# Patient Record
Sex: Female | Born: 1955 | Race: White | Hispanic: No | Marital: Married | Smoking: Never smoker
Health system: Southern US, Community
[De-identification: ages and names within clinical notes are randomized; demographics above are authoritative.]

## PROBLEM LIST (undated history)

## (undated) DIAGNOSIS — D649 Anemia, unspecified: Secondary | ICD-10-CM

## (undated) DIAGNOSIS — K219 Gastro-esophageal reflux disease without esophagitis: Secondary | ICD-10-CM

## (undated) DIAGNOSIS — M4326 Fusion of spine, lumbar region: Secondary | ICD-10-CM

## (undated) DIAGNOSIS — E7800 Pure hypercholesterolemia, unspecified: Secondary | ICD-10-CM

## (undated) DIAGNOSIS — M961 Postlaminectomy syndrome, not elsewhere classified: Secondary | ICD-10-CM

## (undated) DIAGNOSIS — R51 Headache: Secondary | ICD-10-CM

## (undated) DIAGNOSIS — I10 Essential (primary) hypertension: Secondary | ICD-10-CM

## (undated) DIAGNOSIS — F32A Depression, unspecified: Secondary | ICD-10-CM

## (undated) DIAGNOSIS — F419 Anxiety disorder, unspecified: Secondary | ICD-10-CM

## (undated) DIAGNOSIS — M7918 Myalgia, other site: Secondary | ICD-10-CM

## (undated) DIAGNOSIS — M5106 Intervertebral disc disorders with myelopathy, lumbar region: Secondary | ICD-10-CM

## (undated) DIAGNOSIS — M4712 Other spondylosis with myelopathy, cervical region: Secondary | ICD-10-CM

## (undated) DIAGNOSIS — I959 Hypotension, unspecified: Secondary | ICD-10-CM

## (undated) DIAGNOSIS — M5440 Lumbago with sciatica, unspecified side: Secondary | ICD-10-CM

## (undated) DIAGNOSIS — F329 Major depressive disorder, single episode, unspecified: Secondary | ICD-10-CM

## (undated) DIAGNOSIS — Q762 Congenital spondylolisthesis: Secondary | ICD-10-CM

## (undated) HISTORY — DX: Essential (primary) hypertension: I10

## (undated) HISTORY — DX: Depression, unspecified: F32.A

## (undated) HISTORY — DX: Fusion of spine, lumbar region: M43.26

## (undated) HISTORY — DX: Gastro-esophageal reflux disease without esophagitis: K21.9

## (undated) HISTORY — PX: OTHER SURGICAL HISTORY: SHX169

## (undated) HISTORY — DX: Anxiety disorder, unspecified: F41.9

## (undated) HISTORY — PX: SPINE SURGERY: SHX786

## (undated) HISTORY — DX: Major depressive disorder, single episode, unspecified: F32.9

## (undated) HISTORY — DX: Postlaminectomy syndrome, not elsewhere classified: M96.1

## (undated) HISTORY — DX: Congenital spondylolisthesis: Q76.2

## (undated) HISTORY — DX: Myalgia, other site: M79.18

## (undated) HISTORY — DX: Pure hypercholesterolemia, unspecified: E78.00

## (undated) HISTORY — DX: Lumbago with sciatica, unspecified side: M54.40

## (undated) HISTORY — DX: Headache: R51

## (undated) HISTORY — DX: Intervertebral disc disorders with myelopathy, lumbar region: M51.06

## (undated) HISTORY — DX: Hypotension, unspecified: I95.9

## (undated) HISTORY — DX: Other spondylosis with myelopathy, cervical region: M47.12

## (undated) HISTORY — DX: Anemia, unspecified: D64.9

---

## 2009-01-30 ENCOUNTER — Encounter: Admission: RE | Admit: 2009-01-30 | Discharge: 2009-01-30 | Payer: Self-pay | Admitting: Unknown Physician Specialty

## 2009-01-30 IMAGING — CR DG KNEE COMPLETE 4+V*R*
4 series · 4 of 4 positions shown · non-contrast
Comparison: None

CLINICAL DATA: Right knee pain/injury [DATE].

RIGHT KNEE - COMPLETE 4+ VIEW

[view not recorded (1 of 4)]
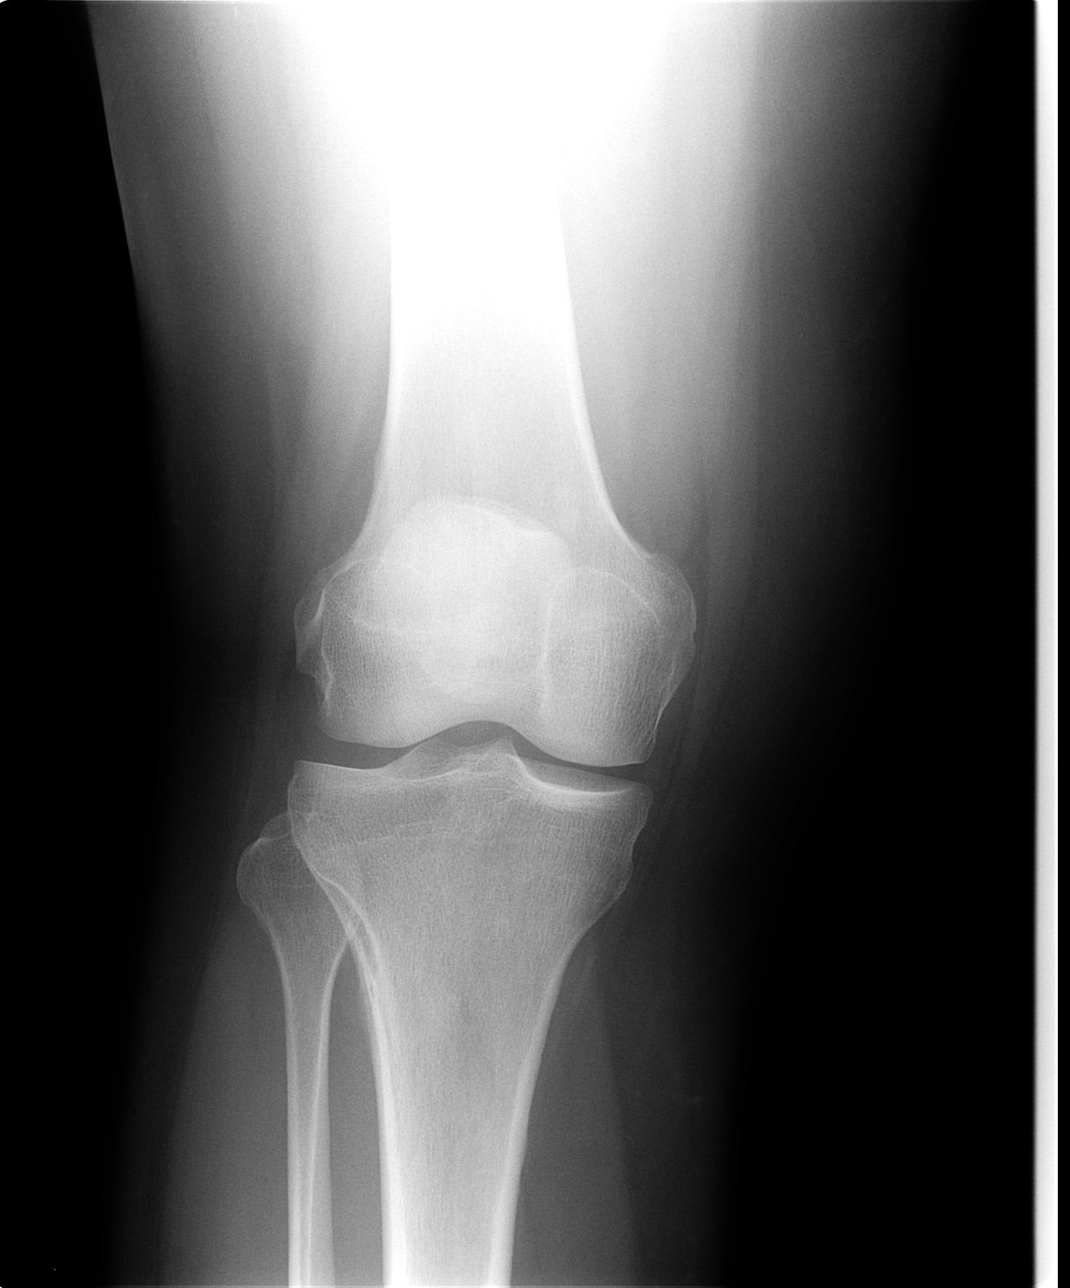

[view not recorded (2 of 4)]
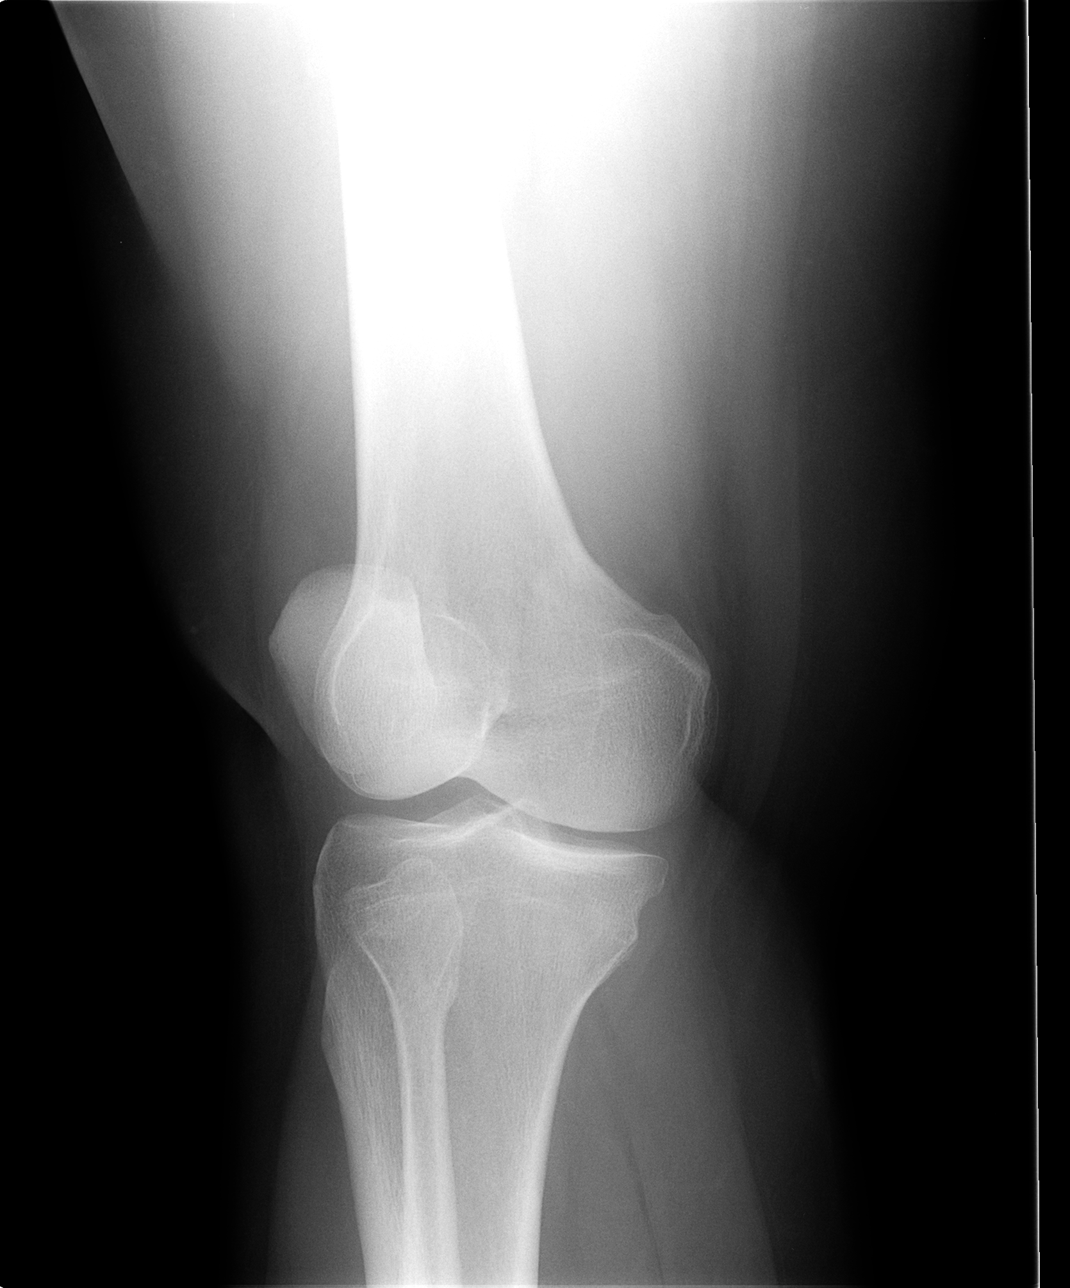

[view not recorded (3 of 4)]
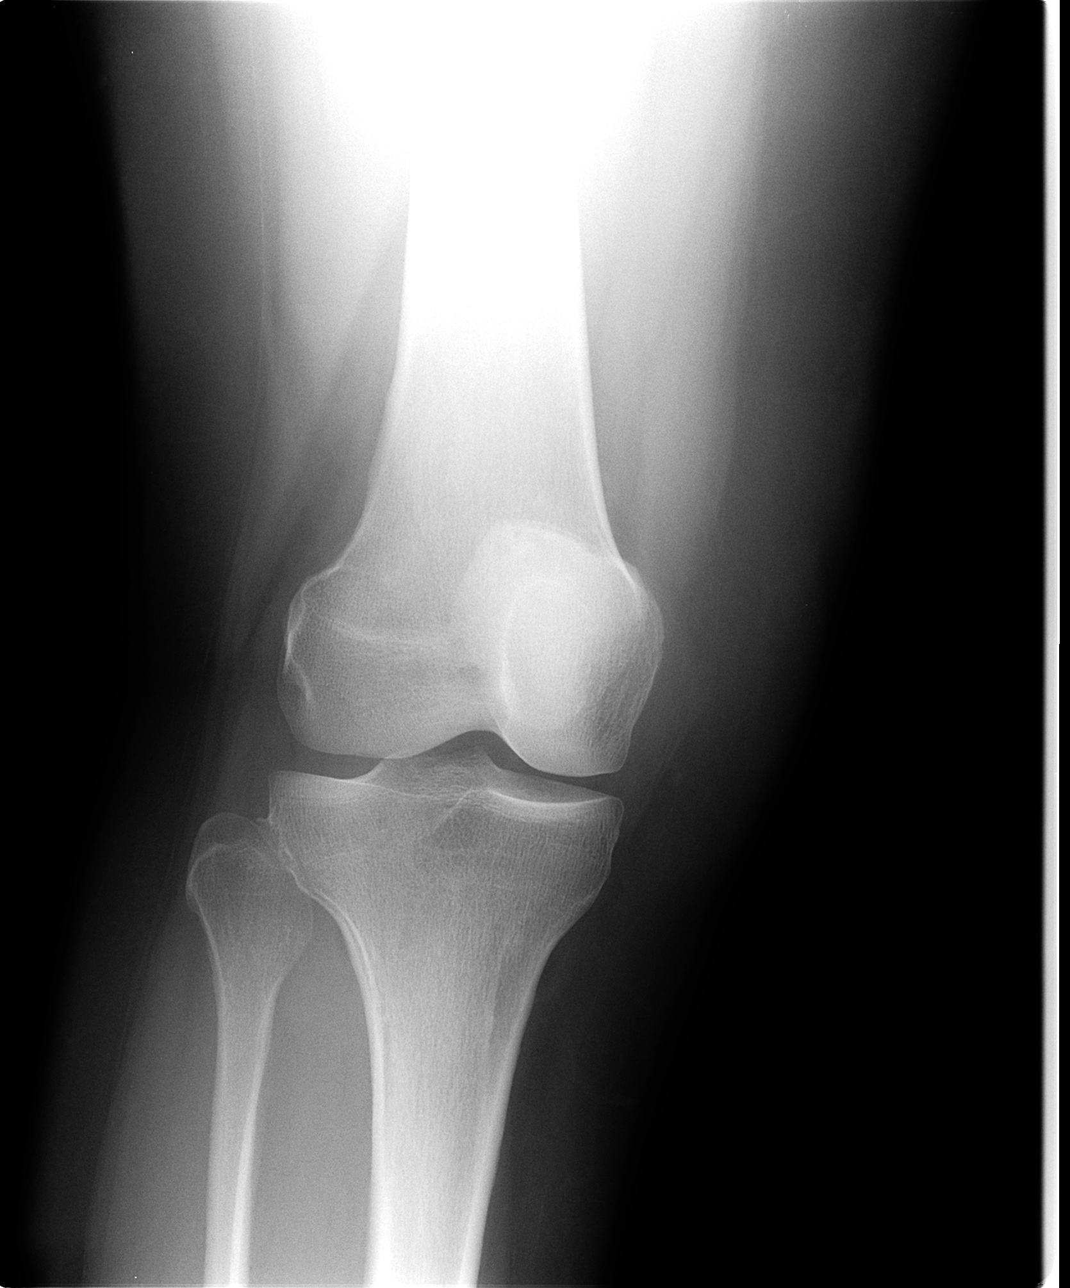

[view not recorded (4 of 4)]
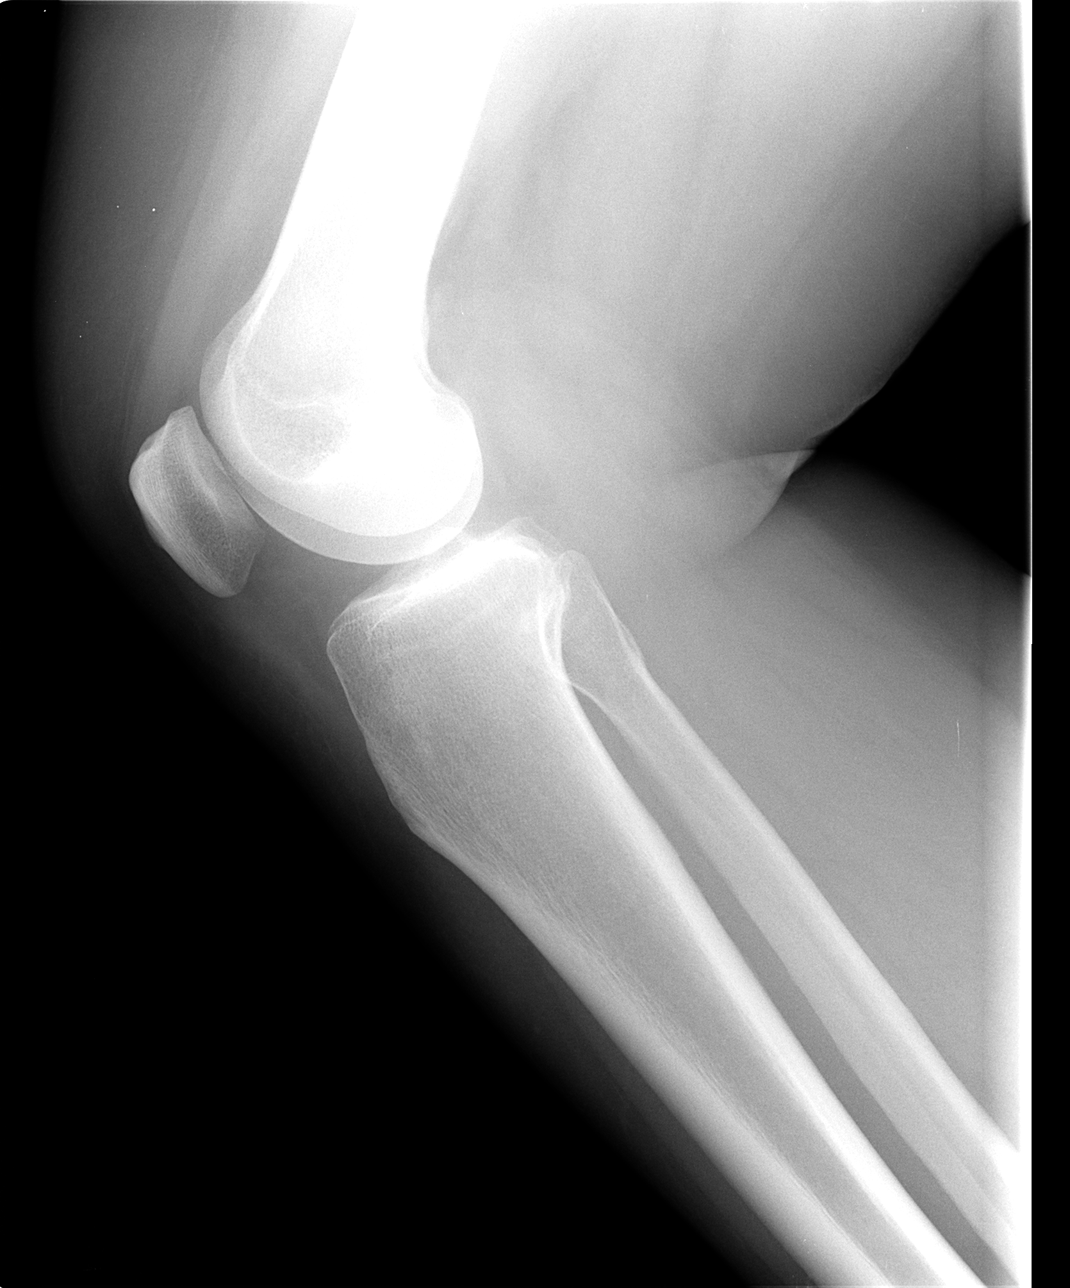

[4 of 4 positions shown; findings below may reference images not displayed]

FINDINGS: No acute fracture, subluxation, radiopaque foreign body,
or joint effusion is seen.  No other significant osseous, articular
abnormality visualized.
IMPRESSION: Negative.

## 2010-01-04 ENCOUNTER — Ambulatory Visit (HOSPITAL_COMMUNITY): Payer: Self-pay | Admitting: Psychiatry

## 2010-02-11 ENCOUNTER — Ambulatory Visit (HOSPITAL_COMMUNITY): Payer: Self-pay | Admitting: Psychiatry

## 2010-03-11 ENCOUNTER — Ambulatory Visit (HOSPITAL_COMMUNITY): Payer: Self-pay | Admitting: Licensed Clinical Social Worker

## 2010-03-20 ENCOUNTER — Ambulatory Visit (HOSPITAL_COMMUNITY): Payer: Self-pay | Admitting: Licensed Clinical Social Worker

## 2010-03-25 ENCOUNTER — Ambulatory Visit (HOSPITAL_COMMUNITY): Payer: Self-pay | Admitting: Psychiatry

## 2010-03-27 ENCOUNTER — Ambulatory Visit (HOSPITAL_COMMUNITY): Payer: Self-pay | Admitting: Licensed Clinical Social Worker

## 2010-04-10 ENCOUNTER — Ambulatory Visit (HOSPITAL_COMMUNITY): Payer: Self-pay | Admitting: Licensed Clinical Social Worker

## 2010-05-15 ENCOUNTER — Ambulatory Visit (HOSPITAL_COMMUNITY): Payer: Self-pay | Admitting: Psychiatry

## 2010-05-16 ENCOUNTER — Ambulatory Visit (HOSPITAL_COMMUNITY): Payer: Self-pay | Admitting: Licensed Clinical Social Worker

## 2010-05-31 IMAGING — CR DG CHEST 2V
2 series · 2 of 2 positions shown · non-contrast
Comparison: None

CLINICAL DATA: Preoperative evaluation.  Herniated disc.  No
current complaints.  Nonsmoker.  Hypertension treated medically

CHEST - 2 VIEW

[view not recorded (1 of 2)]
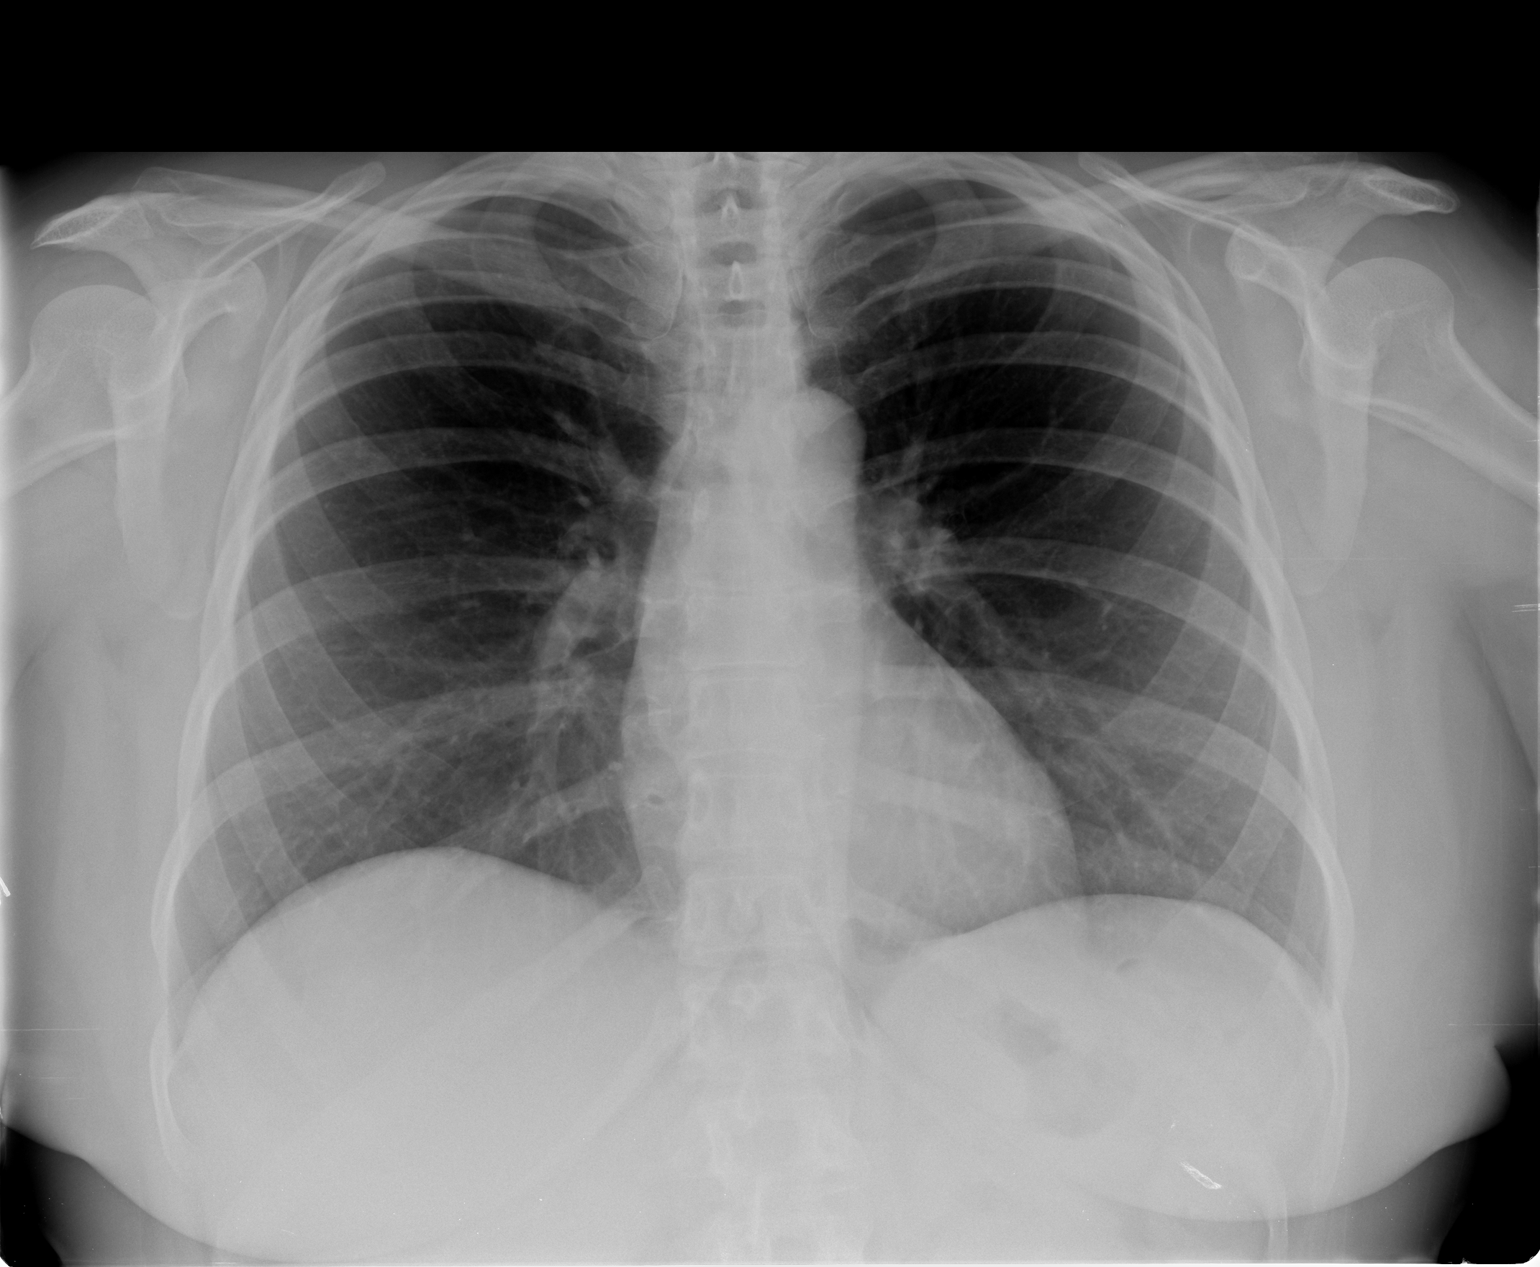

[view not recorded (2 of 2)]
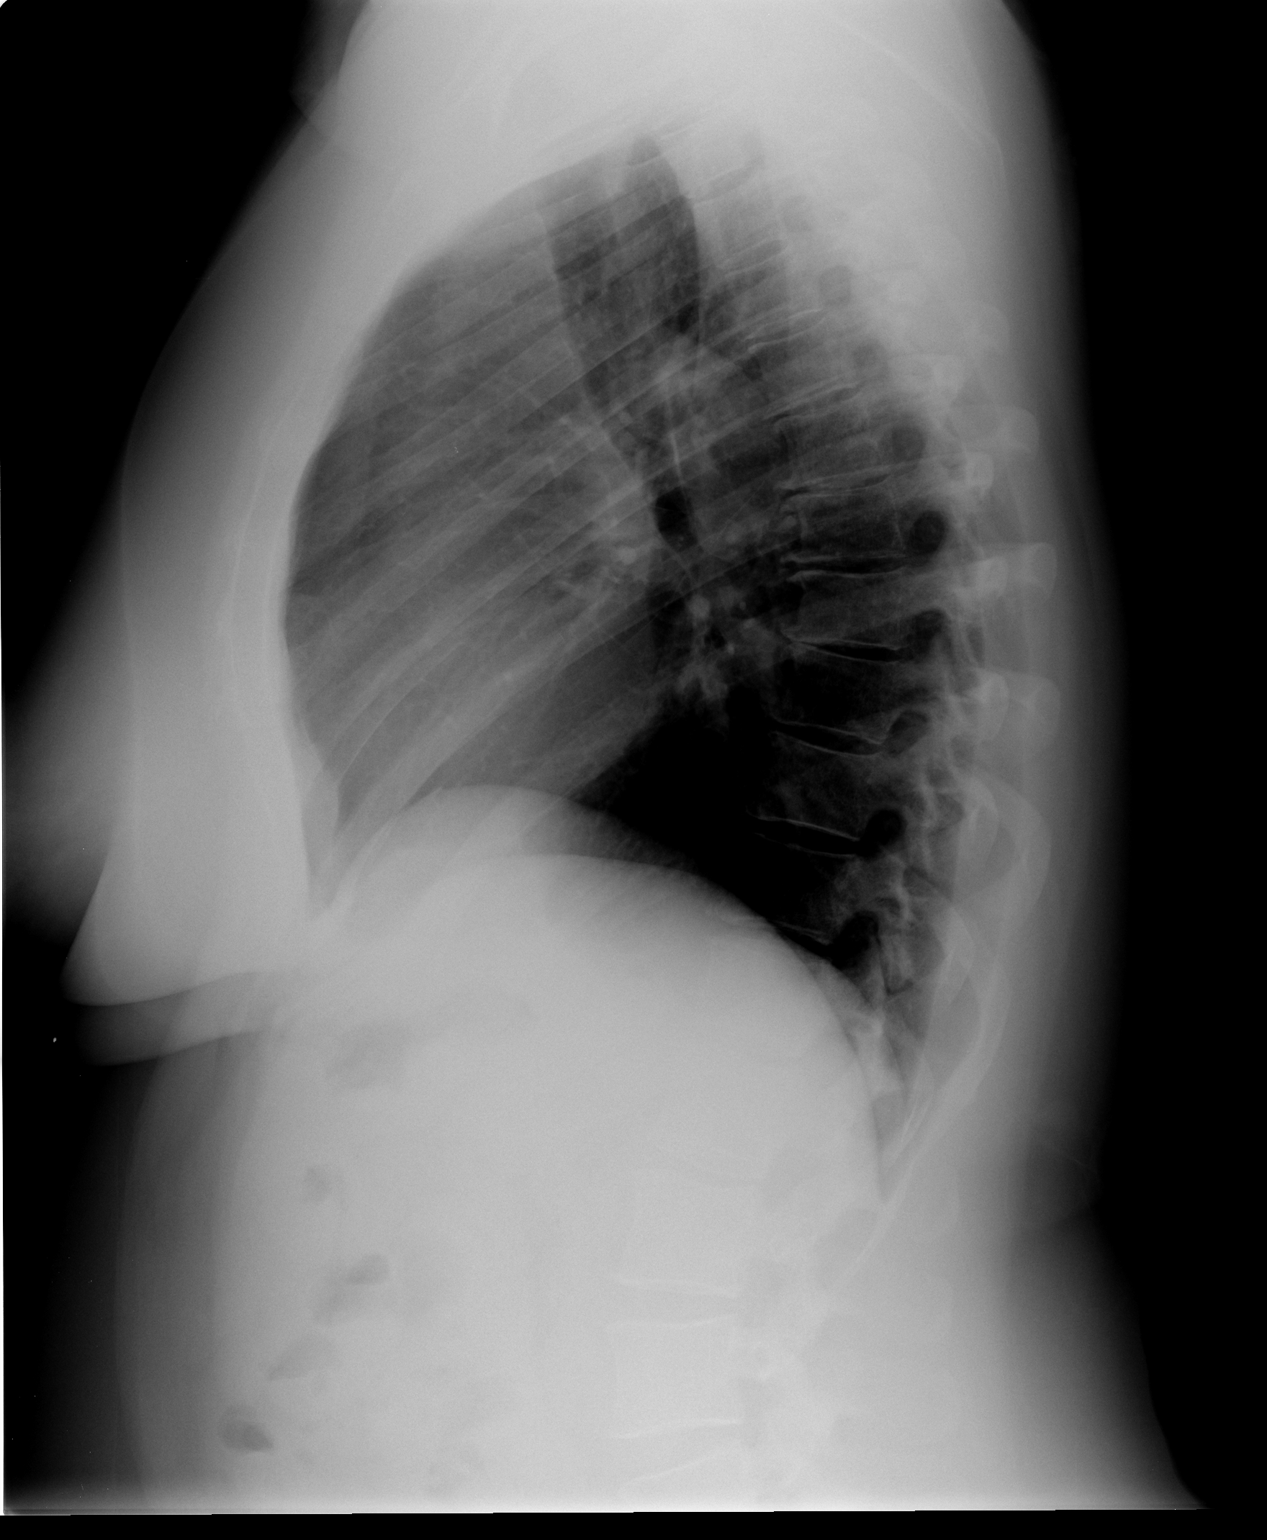

[2 of 2 positions shown; findings below may reference images not displayed]

FINDINGS: Heart and mediastinal contours are within normal limits.
The lung fields are clear with no signs of focal infiltrate or
congestive failure.  No pleural fluid or peribronchial cuffing is
seen.

Bony structures demonstrate mild degenerative changes of the mid
thoracic spine and are otherwise intact.
IMPRESSION: No acute or focal cardiopulmonary abnormality noted

## 2010-06-04 ENCOUNTER — Inpatient Hospital Stay (HOSPITAL_COMMUNITY): Admission: RE | Admit: 2010-06-04 | Discharge: 2010-06-08 | Payer: Self-pay | Admitting: Orthopedic Surgery

## 2010-06-04 IMAGING — CR DG LUMBAR SPINE COMPLETE 4+V
1 series · 1 of 1 positions shown · non-contrast
Comparison: None.

CLINICAL DATA: 54-year-old female undergoing lumbar surgery.

LUMBAR SPINE - COMPLETE 4+ VIEW

[view not recorded]
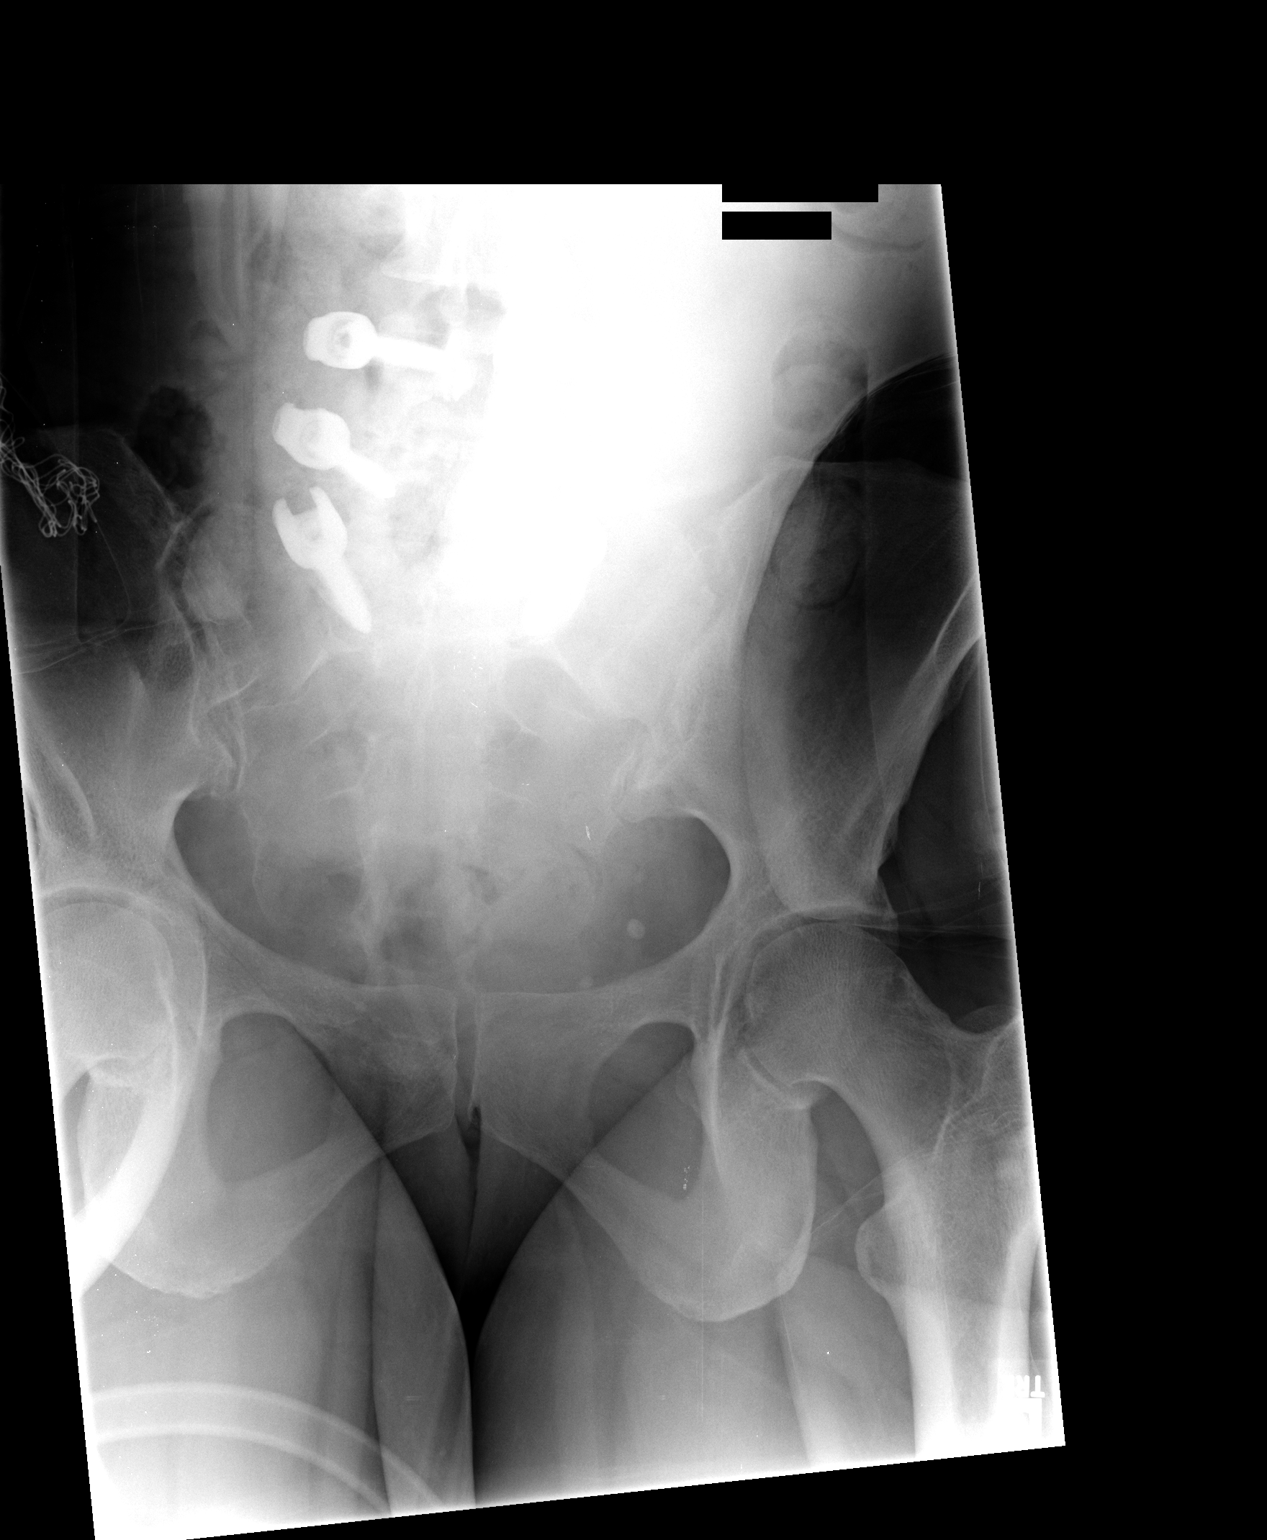

[1 of 1 positions shown; findings below may reference images not displayed]

FINDINGS: 5 intraoperative views of the lumbar spine, mostly cross-
table lateral.

Film #1 at [XI] hours.  Lumbar segmentation appears normal based on
this view.  There is a surgical probe at the L4-L5 disc space
level, overlying the inferior L4 articulating facet.

Film #2 at [XI] hours.  Transpedicular hardware in place at L4, L5,
and S1.

Film #3 at [XI] hours.  The same transpedicular hardware is re-
identified.

Film #4 labeled [XI] hours is an AP portable view.  The L4-S1
bilateral transpedicular hardware is re-identified.  Decompressive
laminectomy changes are evident at L5-S1 and to a lesser extent at
L4-L5.

Film #5 labeled [XI] hours.  L4, L5, and S1 transpedicular hardware
re-identified.  There is instrumentation of the L5 level hardware
on this image. No radiopaque connecting rods are identified.
IMPRESSION: L4 through S1 lumbar fusion and decompression changes depicted as
above.

## 2010-06-04 IMAGING — CR DG LUMBAR SPINE COMPLETE 4+V
1 series · 1 of 1 positions shown · non-contrast
Comparison: None.

CLINICAL DATA: 54-year-old female undergoing lumbar surgery.

LUMBAR SPINE - COMPLETE 4+ VIEW

[view not recorded]
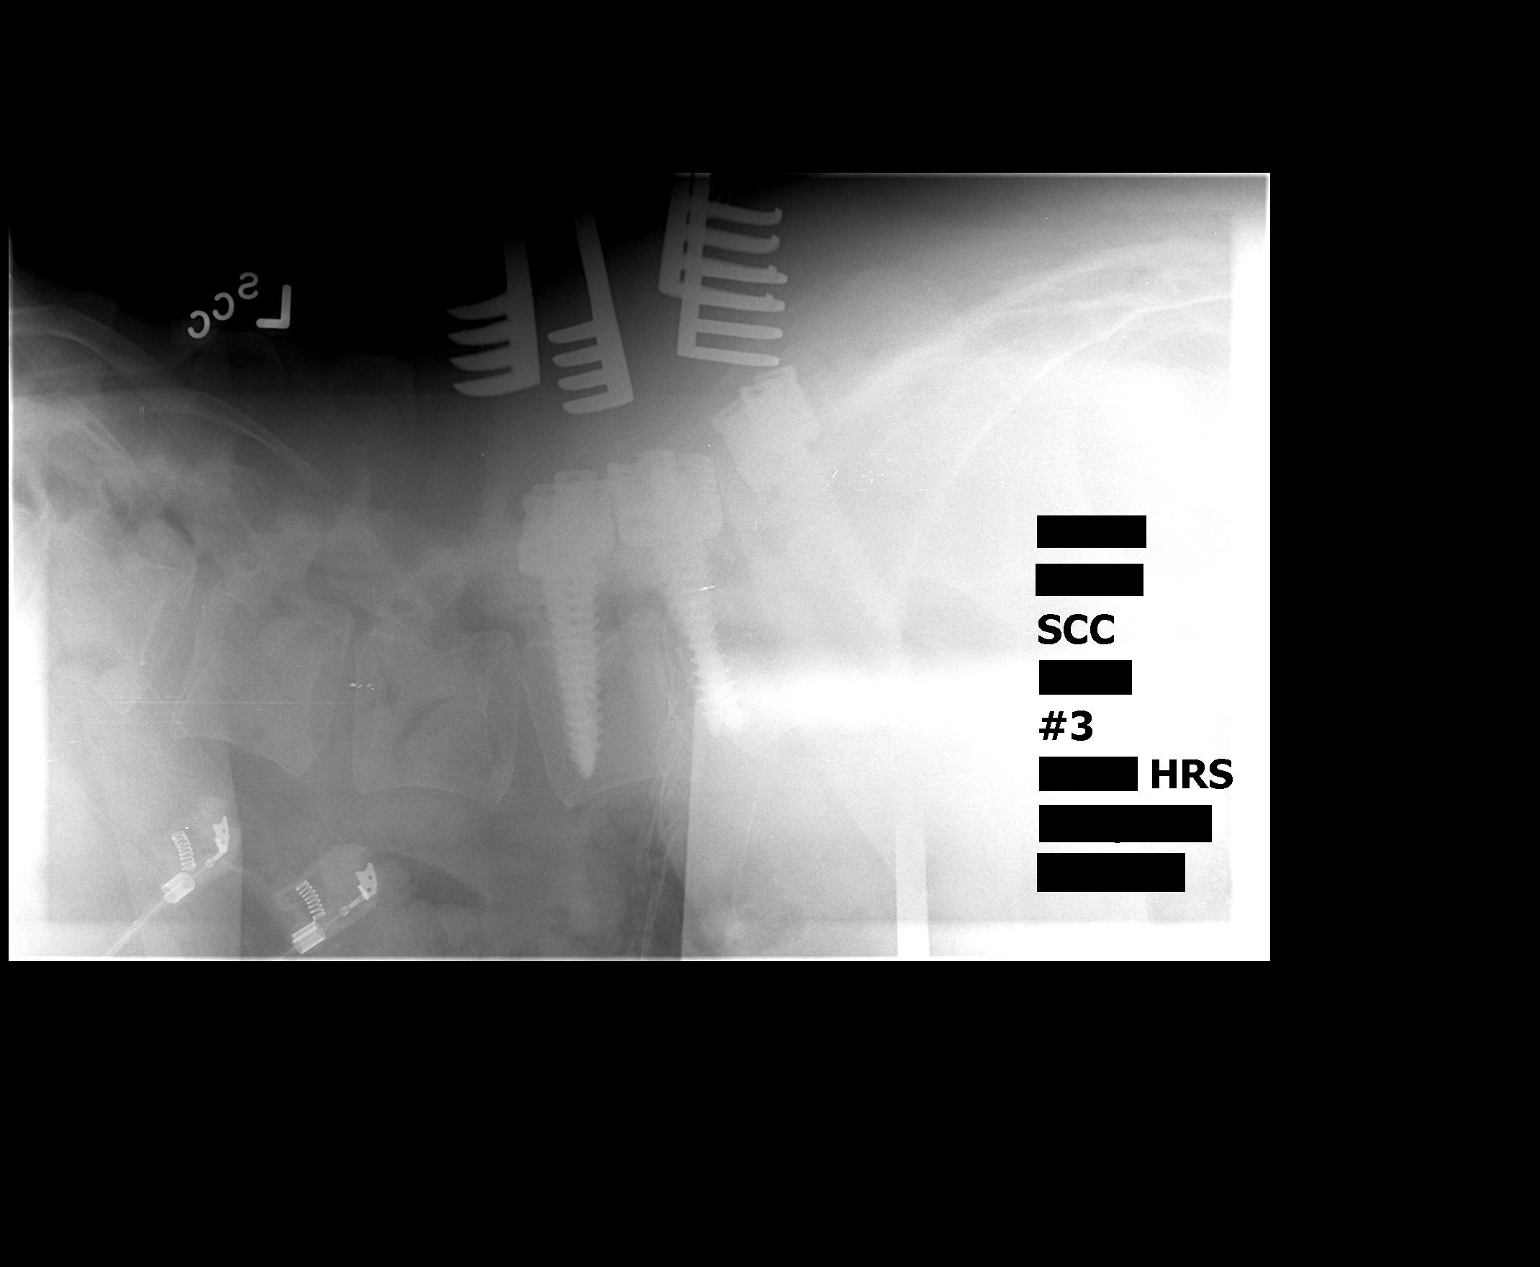

[1 of 1 positions shown; findings below may reference images not displayed]

FINDINGS: 5 intraoperative views of the lumbar spine, mostly cross-
table lateral.

Film #1 at [XI] hours.  Lumbar segmentation appears normal based on
this view.  There is a surgical probe at the L4-L5 disc space
level, overlying the inferior L4 articulating facet.

Film #2 at [XI] hours.  Transpedicular hardware in place at L4, L5,
and S1.

Film #3 at [XI] hours.  The same transpedicular hardware is re-
identified.

Film #4 labeled [XI] hours is an AP portable view.  The L4-S1
bilateral transpedicular hardware is re-identified.  Decompressive
laminectomy changes are evident at L5-S1 and to a lesser extent at
L4-L5.

Film #5 labeled [XI] hours.  L4, L5, and S1 transpedicular hardware
re-identified.  There is instrumentation of the L5 level hardware
on this image. No radiopaque connecting rods are identified.
IMPRESSION: L4 through S1 lumbar fusion and decompression changes depicted as
above.

## 2010-06-04 IMAGING — CR DG LUMBAR SPINE COMPLETE 4+V
1 series · 1 of 1 positions shown · non-contrast
Comparison: None.

CLINICAL DATA: 54-year-old female undergoing lumbar surgery.

LUMBAR SPINE - COMPLETE 4+ VIEW

[view not recorded]
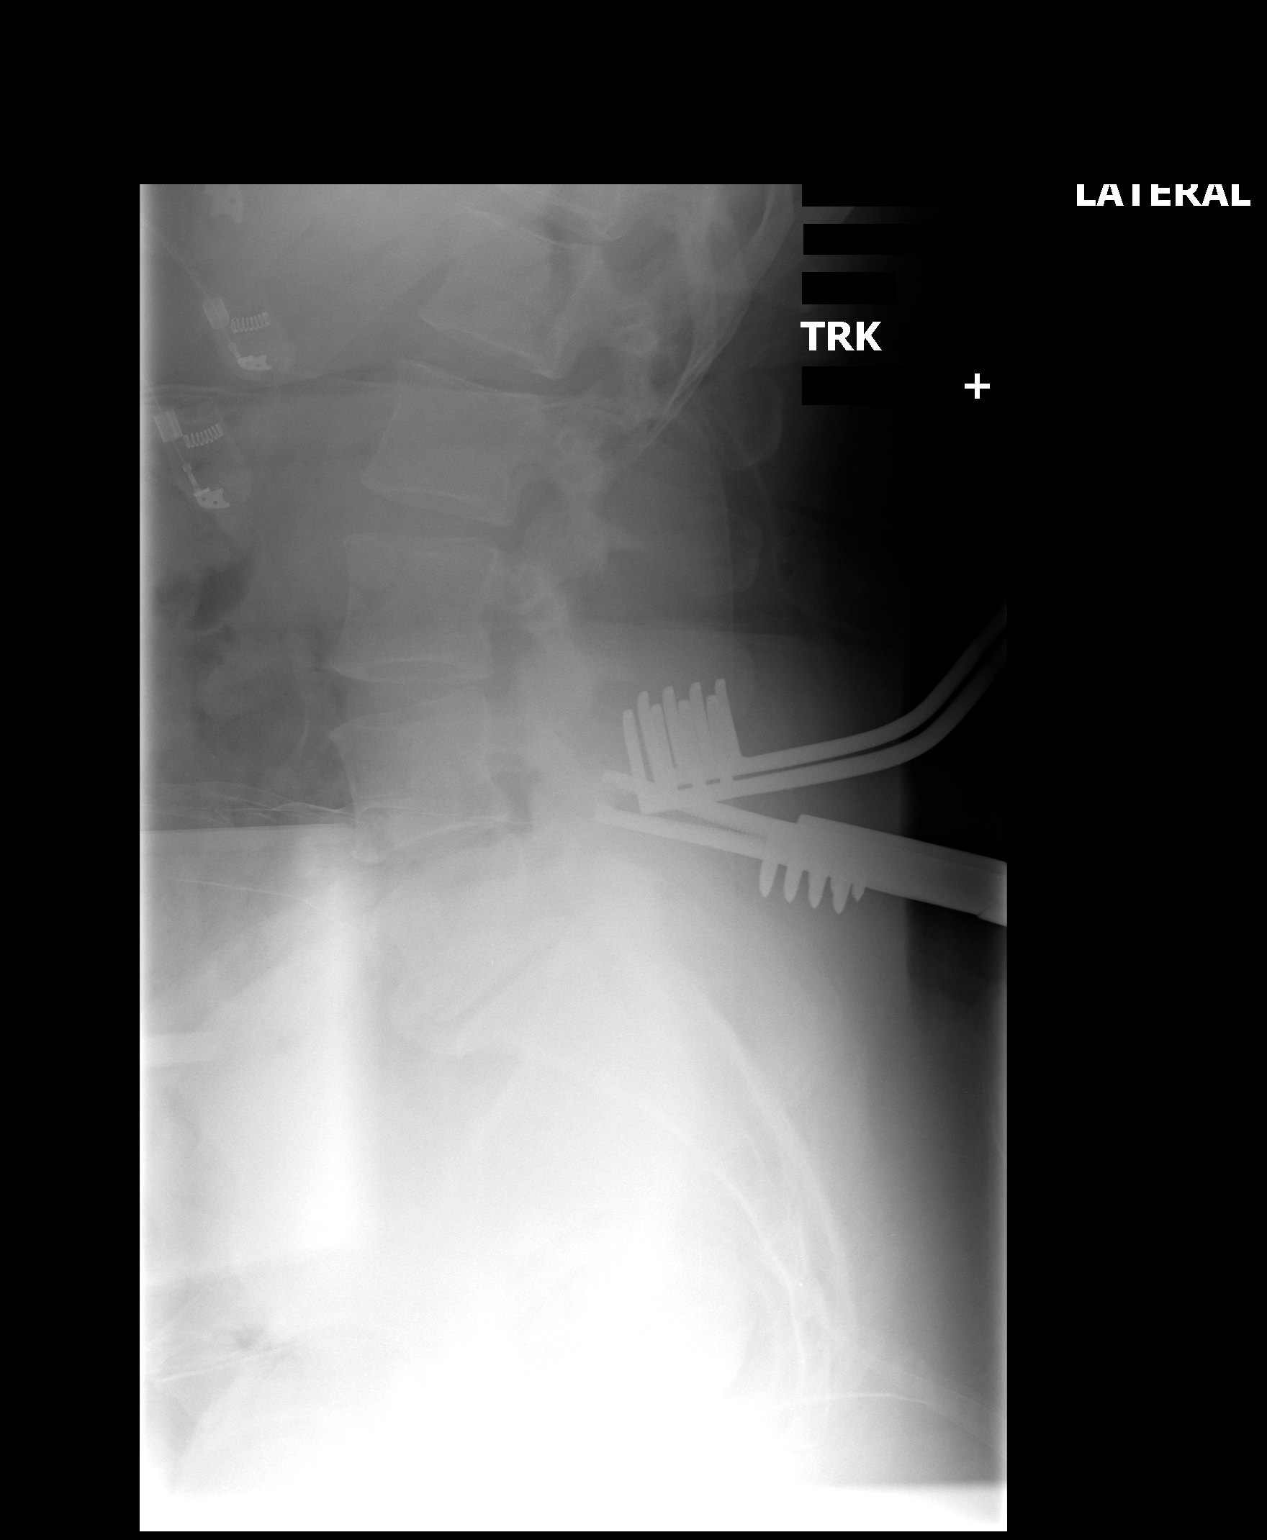

[1 of 1 positions shown; findings below may reference images not displayed]

FINDINGS: 5 intraoperative views of the lumbar spine, mostly cross-
table lateral.

Film #1 at [XI] hours.  Lumbar segmentation appears normal based on
this view.  There is a surgical probe at the L4-L5 disc space
level, overlying the inferior L4 articulating facet.

Film #2 at [XI] hours.  Transpedicular hardware in place at L4, L5,
and S1.

Film #3 at [XI] hours.  The same transpedicular hardware is re-
identified.

Film #4 labeled [XI] hours is an AP portable view.  The L4-S1
bilateral transpedicular hardware is re-identified.  Decompressive
laminectomy changes are evident at L5-S1 and to a lesser extent at
L4-L5.

Film #5 labeled [XI] hours.  L4, L5, and S1 transpedicular hardware
re-identified.  There is instrumentation of the L5 level hardware
on this image. No radiopaque connecting rods are identified.
IMPRESSION: L4 through S1 lumbar fusion and decompression changes depicted as
above.

## 2010-06-04 IMAGING — CR DG LUMBAR SPINE COMPLETE 4+V
1 series · 1 of 1 positions shown · non-contrast
Comparison: None.

CLINICAL DATA: 54-year-old female undergoing lumbar surgery.

LUMBAR SPINE - COMPLETE 4+ VIEW

[view not recorded]
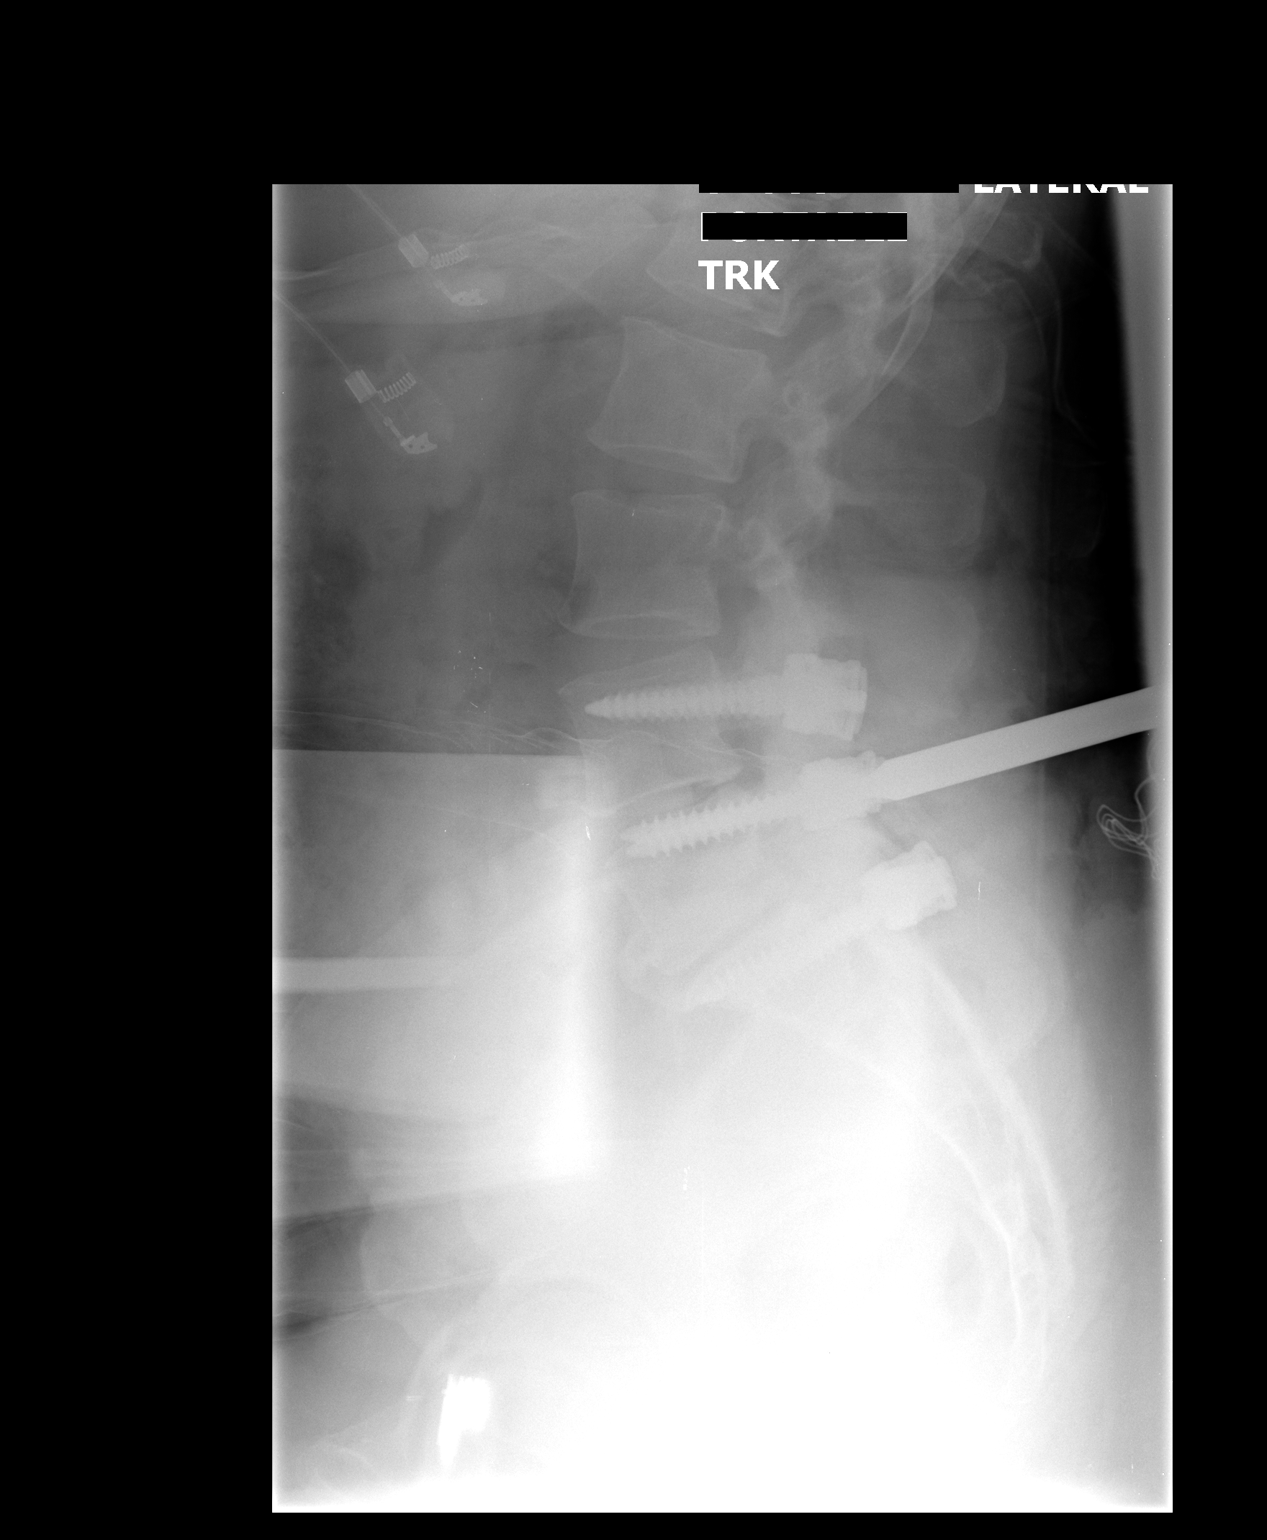

[1 of 1 positions shown; findings below may reference images not displayed]

FINDINGS: 5 intraoperative views of the lumbar spine, mostly cross-
table lateral.

Film #1 at [XI] hours.  Lumbar segmentation appears normal based on
this view.  There is a surgical probe at the L4-L5 disc space
level, overlying the inferior L4 articulating facet.

Film #2 at [XI] hours.  Transpedicular hardware in place at L4, L5,
and S1.

Film #3 at [XI] hours.  The same transpedicular hardware is re-
identified.

Film #4 labeled [XI] hours is an AP portable view.  The L4-S1
bilateral transpedicular hardware is re-identified.  Decompressive
laminectomy changes are evident at L5-S1 and to a lesser extent at
L4-L5.

Film #5 labeled [XI] hours.  L4, L5, and S1 transpedicular hardware
re-identified.  There is instrumentation of the L5 level hardware
on this image. No radiopaque connecting rods are identified.
IMPRESSION: L4 through S1 lumbar fusion and decompression changes depicted as
above.

## 2010-06-04 IMAGING — CR DG LUMBAR SPINE COMPLETE 4+V
1 series · 1 of 1 positions shown · non-contrast
Comparison: None.

CLINICAL DATA: 54-year-old female undergoing lumbar surgery.

LUMBAR SPINE - COMPLETE 4+ VIEW

[view not recorded]
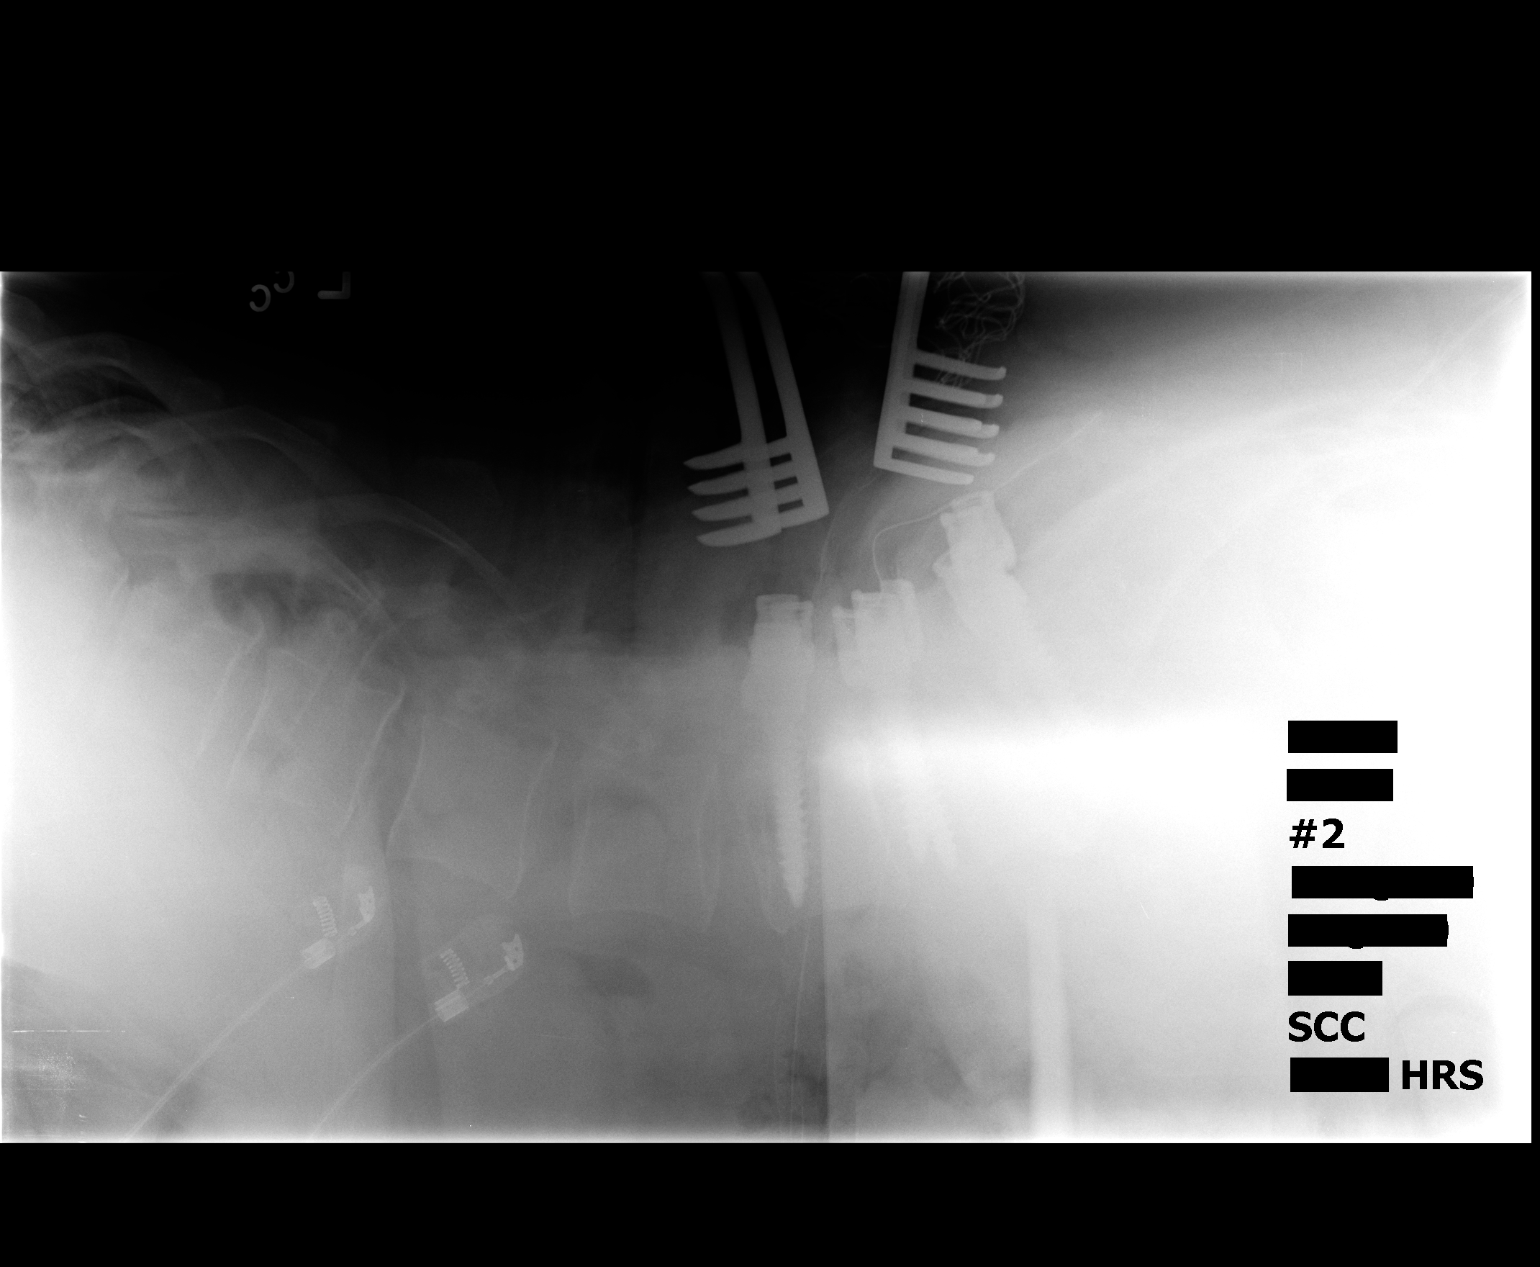

[1 of 1 positions shown; findings below may reference images not displayed]

FINDINGS: 5 intraoperative views of the lumbar spine, mostly cross-
table lateral.

Film #1 at [XI] hours.  Lumbar segmentation appears normal based on
this view.  There is a surgical probe at the L4-L5 disc space
level, overlying the inferior L4 articulating facet.

Film #2 at [XI] hours.  Transpedicular hardware in place at L4, L5,
and S1.

Film #3 at [XI] hours.  The same transpedicular hardware is re-
identified.

Film #4 labeled [XI] hours is an AP portable view.  The L4-S1
bilateral transpedicular hardware is re-identified.  Decompressive
laminectomy changes are evident at L5-S1 and to a lesser extent at
L4-L5.

Film #5 labeled [XI] hours.  L4, L5, and S1 transpedicular hardware
re-identified.  There is instrumentation of the L5 level hardware
on this image. No radiopaque connecting rods are identified.
IMPRESSION: L4 through S1 lumbar fusion and decompression changes depicted as
above.

## 2010-06-06 IMAGING — CR DG LUMBAR SPINE 2-3V
2 series · 2 of 2 positions shown · non-contrast
Comparison: Intraoperative films [DATE].

CLINICAL DATA: Full.  Recent effusion.

LUMBAR SPINE - 2-3 VIEW

[t l-spine a.p.]
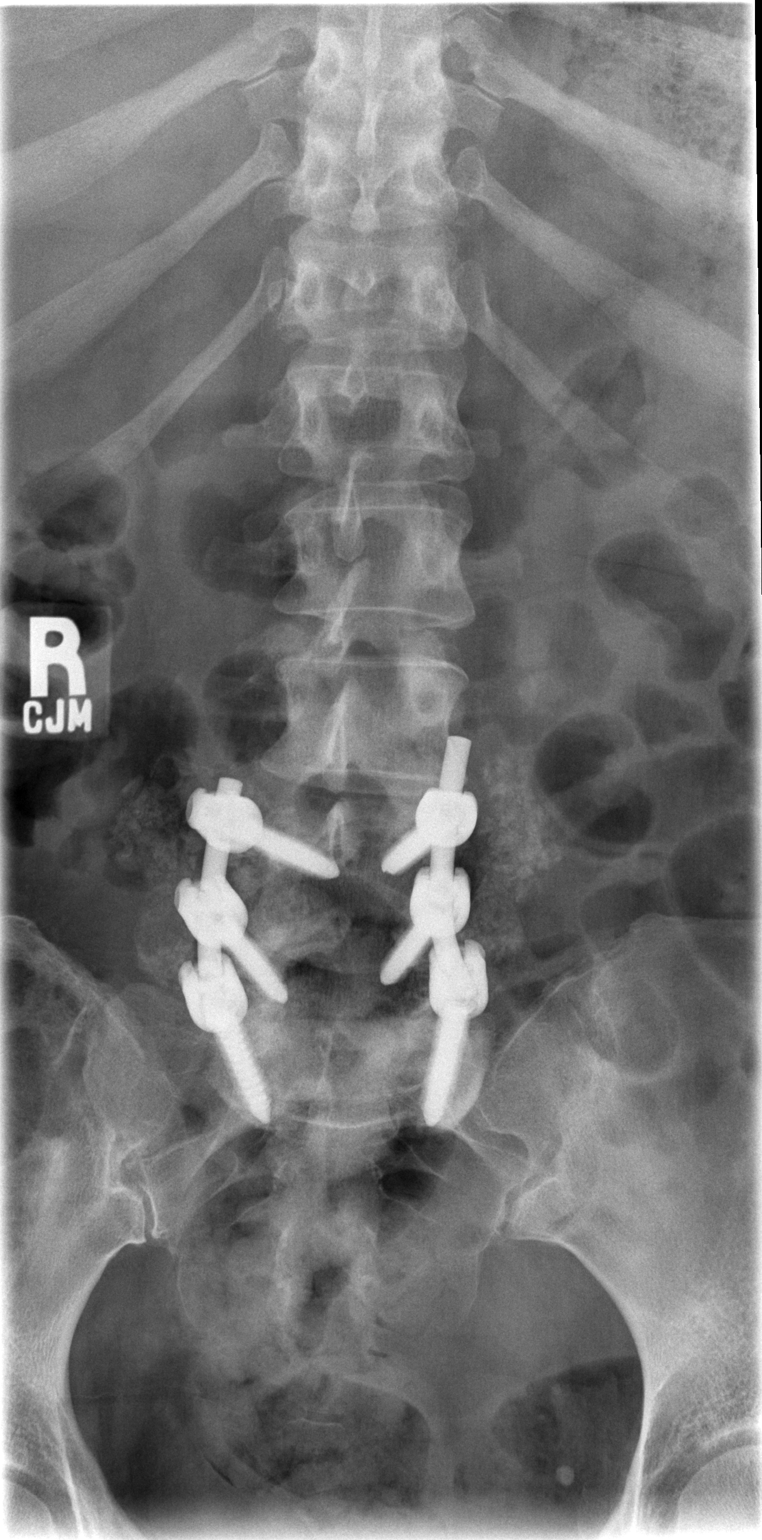

[t l-spine lat]
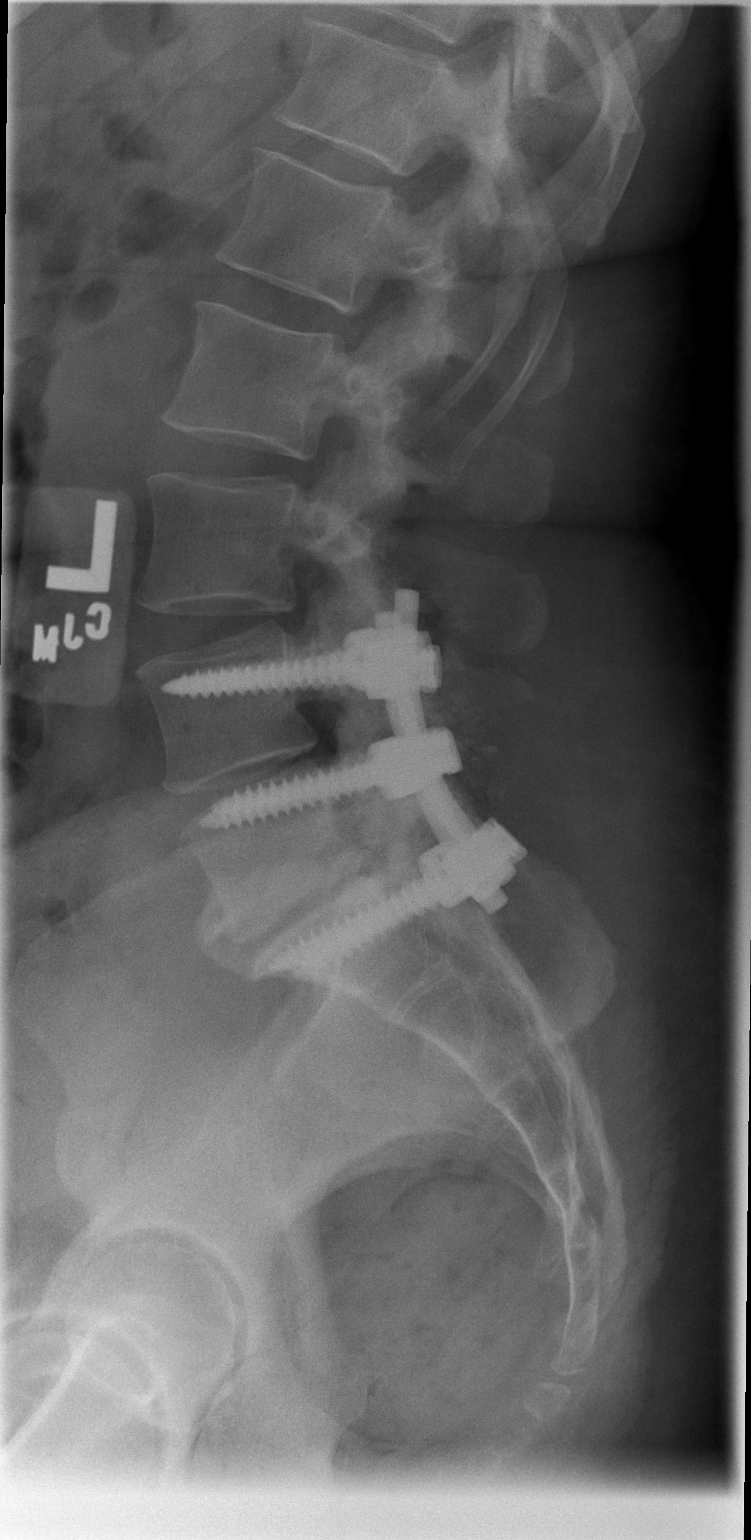

[2 of 2 positions shown; findings below may reference images not displayed]

FINDINGS: The patient is status post L4-5 and L5-S1 PLIF with a
right hemilaminotomy at L4.  Graft material is seen bilaterally.
The pedicle screws at L5 are near the superior endplate.  The
hardware is intact.  Alignment at the fused sites is anatomic.
Slight retrolisthesis at L3-4 is stable.
IMPRESSION: 1.  Status post L4-5 and L5-S1 PLIF.
2.  No acute abnormality or significant interval change.
3.  The pedicle screws at L4 terminate near the superior endplate.

## 2010-07-15 ENCOUNTER — Ambulatory Visit (HOSPITAL_COMMUNITY): Payer: Self-pay | Admitting: Psychiatry

## 2010-07-17 ENCOUNTER — Ambulatory Visit (HOSPITAL_COMMUNITY): Payer: Self-pay | Admitting: Licensed Clinical Social Worker

## 2010-08-12 ENCOUNTER — Ambulatory Visit (HOSPITAL_COMMUNITY): Payer: Self-pay | Admitting: Psychiatry

## 2010-09-06 ENCOUNTER — Ambulatory Visit (HOSPITAL_COMMUNITY): Payer: Self-pay | Admitting: Licensed Clinical Social Worker

## 2010-09-16 ENCOUNTER — Ambulatory Visit (HOSPITAL_COMMUNITY): Payer: Self-pay | Admitting: Psychiatry

## 2010-10-15 ENCOUNTER — Ambulatory Visit (HOSPITAL_COMMUNITY)
Admission: RE | Admit: 2010-10-15 | Discharge: 2010-10-15 | Payer: Self-pay | Source: Home / Self Care | Attending: Licensed Clinical Social Worker | Admitting: Licensed Clinical Social Worker

## 2010-10-29 ENCOUNTER — Ambulatory Visit (HOSPITAL_COMMUNITY)
Admission: RE | Admit: 2010-10-29 | Discharge: 2010-10-29 | Payer: Self-pay | Source: Home / Self Care | Attending: Psychiatry | Admitting: Psychiatry

## 2010-10-30 ENCOUNTER — Encounter (HOSPITAL_COMMUNITY): Payer: Self-pay | Admitting: Licensed Clinical Social Worker

## 2010-10-30 ENCOUNTER — Ambulatory Visit (HOSPITAL_COMMUNITY): Admit: 2010-10-30 | Payer: Self-pay | Admitting: Licensed Clinical Social Worker

## 2010-11-27 ENCOUNTER — Encounter (INDEPENDENT_AMBULATORY_CARE_PROVIDER_SITE_OTHER): Payer: 59 | Admitting: Psychiatry

## 2010-11-27 DIAGNOSIS — F339 Major depressive disorder, recurrent, unspecified: Secondary | ICD-10-CM

## 2010-11-27 DIAGNOSIS — F411 Generalized anxiety disorder: Secondary | ICD-10-CM

## 2010-11-29 ENCOUNTER — Encounter (HOSPITAL_COMMUNITY): Payer: Self-pay | Admitting: Licensed Clinical Social Worker

## 2010-12-12 ENCOUNTER — Encounter (HOSPITAL_COMMUNITY): Payer: 59 | Admitting: Licensed Clinical Social Worker

## 2010-12-12 LAB — TYPE AND SCREEN: Antibody Screen: NEGATIVE

## 2010-12-12 LAB — DIFFERENTIAL
Basophils Relative: 0 % (ref 0–1)
Eosinophils Absolute: 0 10*3/uL (ref 0.0–0.7)
Monocytes Relative: 6 % (ref 3–12)
Neutrophils Relative %: 72 % (ref 43–77)

## 2010-12-12 LAB — BASIC METABOLIC PANEL
BUN: 10 mg/dL (ref 6–23)
BUN: 3 mg/dL — ABNORMAL LOW (ref 6–23)
Calcium: 7.9 mg/dL — ABNORMAL LOW (ref 8.4–10.5)
Creatinine, Ser: 0.97 mg/dL (ref 0.4–1.2)
GFR calc Af Amer: >60 mL/min (ref >60)
GFR calc non Af Amer: >60 mL/min (ref >60)
GFR calc non Af Amer: >60 mL/min (ref >60)
GFR calc non Af Amer: >60 mL/min (ref >60)
Glucose, Bld: 129 mg/dL — ABNORMAL HIGH (ref 70–99)
Potassium: 2.7 mEq/L — CL (ref 3.5–5.1)
Potassium: 3.3 mEq/L — ABNORMAL LOW (ref 3.5–5.1)
Sodium: 135 mEq/L (ref 135–145)
Sodium: 136 mEq/L (ref 135–145)

## 2010-12-12 LAB — COMPREHENSIVE METABOLIC PANEL
ALT: 12 U/L (ref 0–35)
Alkaline Phosphatase: 93 U/L (ref 39–117)
CO2: 33 mEq/L — ABNORMAL HIGH (ref 19–32)
Chloride: 98 mEq/L (ref 96–112)
Glucose, Bld: 98 mg/dL (ref 70–99)
Potassium: 3.7 mEq/L (ref 3.5–5.1)
Sodium: 139 mEq/L (ref 135–145)
Total Bilirubin: 0.4 mg/dL (ref 0.3–1.2)
Total Protein: 6.8 g/dL (ref 6.0–8.3)

## 2010-12-12 LAB — PROTIME-INR: INR: 0.96 (ref 0.00–1.49)

## 2010-12-12 LAB — SURGICAL PCR SCREEN
MRSA, PCR: NEGATIVE
Staphylococcus aureus: NEGATIVE

## 2010-12-12 LAB — URINALYSIS, ROUTINE W REFLEX MICROSCOPIC
Nitrite: NEGATIVE
Specific Gravity, Urine: 1.022 (ref 1.005–1.030)
pH: 7.5 (ref 5.0–8.0)

## 2010-12-12 LAB — CBC
HCT: 24.7 % — ABNORMAL LOW (ref 36.0–46.0)
HCT: 38.8 % (ref 36.0–46.0)
Hemoglobin: 13 g/dL (ref 12.0–15.0)
Hemoglobin: 8.1 g/dL — ABNORMAL LOW (ref 12.0–15.0)
Platelets: 259 10*3/uL (ref 150–400)
RBC: 4.19 MIL/uL (ref 3.87–5.11)
RDW: 12.9 % (ref 11.5–15.5)
RDW: 13.1 % (ref 11.5–15.5)
WBC: 11.8 10*3/uL — ABNORMAL HIGH (ref 4.0–10.5)
WBC: 11.8 10*3/uL — ABNORMAL HIGH (ref 4.0–10.5)
WBC: 7.8 10*3/uL (ref 4.0–10.5)

## 2010-12-12 LAB — HEMOGLOBIN AND HEMATOCRIT, BLOOD
HCT: 23.6 % — ABNORMAL LOW (ref 36.0–46.0)
Hemoglobin: 7.9 g/dL — ABNORMAL LOW (ref 12.0–15.0)
Hemoglobin: 9.6 g/dL — ABNORMAL LOW (ref 12.0–15.0)

## 2010-12-12 LAB — ABO/RH: ABO/RH(D): O POS

## 2010-12-12 LAB — PREPARE RBC (CROSSMATCH)

## 2010-12-19 ENCOUNTER — Encounter (INDEPENDENT_AMBULATORY_CARE_PROVIDER_SITE_OTHER): Payer: 59 | Admitting: Licensed Clinical Social Worker

## 2010-12-19 DIAGNOSIS — F331 Major depressive disorder, recurrent, moderate: Secondary | ICD-10-CM

## 2010-12-19 DIAGNOSIS — F411 Generalized anxiety disorder: Secondary | ICD-10-CM

## 2011-01-21 ENCOUNTER — Encounter (INDEPENDENT_AMBULATORY_CARE_PROVIDER_SITE_OTHER): Payer: 59 | Admitting: Licensed Clinical Social Worker

## 2011-01-21 DIAGNOSIS — F411 Generalized anxiety disorder: Secondary | ICD-10-CM

## 2011-01-29 ENCOUNTER — Encounter (HOSPITAL_COMMUNITY): Payer: 59 | Admitting: Psychiatry

## 2011-02-20 ENCOUNTER — Encounter (INDEPENDENT_AMBULATORY_CARE_PROVIDER_SITE_OTHER): Payer: 59 | Admitting: Psychiatry

## 2011-02-20 DIAGNOSIS — F411 Generalized anxiety disorder: Secondary | ICD-10-CM

## 2011-02-20 DIAGNOSIS — F339 Major depressive disorder, recurrent, unspecified: Secondary | ICD-10-CM

## 2011-02-21 ENCOUNTER — Encounter (INDEPENDENT_AMBULATORY_CARE_PROVIDER_SITE_OTHER): Payer: 59 | Admitting: Licensed Clinical Social Worker

## 2011-02-21 DIAGNOSIS — F411 Generalized anxiety disorder: Secondary | ICD-10-CM

## 2011-03-21 ENCOUNTER — Encounter (INDEPENDENT_AMBULATORY_CARE_PROVIDER_SITE_OTHER): Payer: 59 | Admitting: Licensed Clinical Social Worker

## 2011-03-21 DIAGNOSIS — F411 Generalized anxiety disorder: Secondary | ICD-10-CM

## 2011-04-21 ENCOUNTER — Encounter (INDEPENDENT_AMBULATORY_CARE_PROVIDER_SITE_OTHER): Payer: 59 | Admitting: Psychiatry

## 2011-04-21 DIAGNOSIS — F339 Major depressive disorder, recurrent, unspecified: Secondary | ICD-10-CM

## 2011-04-21 DIAGNOSIS — F411 Generalized anxiety disorder: Secondary | ICD-10-CM

## 2011-05-14 ENCOUNTER — Encounter (INDEPENDENT_AMBULATORY_CARE_PROVIDER_SITE_OTHER): Payer: 59 | Admitting: Licensed Clinical Social Worker

## 2011-05-14 DIAGNOSIS — F330 Major depressive disorder, recurrent, mild: Secondary | ICD-10-CM

## 2011-07-23 ENCOUNTER — Encounter (INDEPENDENT_AMBULATORY_CARE_PROVIDER_SITE_OTHER): Payer: 59 | Admitting: Psychiatry

## 2011-07-23 DIAGNOSIS — F339 Major depressive disorder, recurrent, unspecified: Secondary | ICD-10-CM

## 2011-07-23 DIAGNOSIS — F341 Dysthymic disorder: Secondary | ICD-10-CM

## 2011-08-14 ENCOUNTER — Encounter (HOSPITAL_COMMUNITY): Payer: Self-pay

## 2011-10-17 ENCOUNTER — Encounter (HOSPITAL_COMMUNITY): Payer: Self-pay

## 2011-10-23 ENCOUNTER — Encounter (HOSPITAL_COMMUNITY): Payer: 59 | Admitting: Psychiatry

## 2011-10-28 ENCOUNTER — Other Ambulatory Visit (HOSPITAL_COMMUNITY): Payer: Self-pay | Admitting: Psychiatry

## 2011-10-28 DIAGNOSIS — F411 Generalized anxiety disorder: Secondary | ICD-10-CM

## 2011-10-28 MED ORDER — CLONAZEPAM 1 MG PO TABS: 1.0000 mg | ORAL_TABLET | Freq: Three times a day (TID) | ORAL | Status: DC

## 2011-10-30 ENCOUNTER — Encounter (HOSPITAL_COMMUNITY): Payer: Self-pay | Admitting: Psychiatry

## 2011-10-30 ENCOUNTER — Ambulatory Visit (INDEPENDENT_AMBULATORY_CARE_PROVIDER_SITE_OTHER): Payer: 59 | Admitting: Psychiatry

## 2011-10-30 DIAGNOSIS — F411 Generalized anxiety disorder: Secondary | ICD-10-CM

## 2011-10-30 DIAGNOSIS — F329 Major depressive disorder, single episode, unspecified: Secondary | ICD-10-CM

## 2011-10-30 NOTE — Progress Notes (Signed)
   [facility] Follow-up Outpatient Visit  Nyx Keady Campton 1956-08-02   Subjective: The patient is a 56 year old female who has been followed by [facility] since April 2011. She is currently diagnosed with major depressive disorder along with generalized anxiety disorder. She has been doing well on her medications. The patient is come along way. She is now able to sit for 15 minutes my office without crying. The patient had rotator cuff repair surgery in November. She is only been out of her sling for 3 weeks. She is back to work. Her niece is still here, and that appears to be going well. The niece is homesick and maybe moving back to [place]. The patient is trying to be more social. She is soon going to be celebrating her 17 one year anniversary. The patient does get weepy with her menstrual cycle. Her blood pressure today is 104/105 in her right arm. He is 132/102 on her left arm on retake.  Filed Vitals:   10/30/11 1454  BP: 142/100    Mental Status Examination  Appearance: Casual Alert: Yes Attention: good  Cooperative: Yes Eye Contact: Good Speech: Regular rate rhythm and volume Psychomotor Activity: Normal Memory/Concentration: Intact Oriented: person, place, time/date and situation Mood: Euthymic Affect: Constricted Thought Processes and Associations: Logical Fund of Knowledge: Fair Thought Content: No suicidal or homicidal thoughts Insight: Fair Judgement: Fair  Diagnosis: Generalized anxiety disorder, major depressive disorder  Treatment Plan: We will not make any changes today. I will see the patient back in 3 months. Patient to call with concerns.  Jamse Mead, MD

## 2011-11-07 ENCOUNTER — Other Ambulatory Visit (HOSPITAL_COMMUNITY): Payer: Self-pay | Admitting: Psychiatry

## 2011-11-17 ENCOUNTER — Other Ambulatory Visit (HOSPITAL_COMMUNITY): Payer: Self-pay | Admitting: Psychiatry

## 2011-11-17 DIAGNOSIS — F411 Generalized anxiety disorder: Secondary | ICD-10-CM

## 2012-01-05 ENCOUNTER — Other Ambulatory Visit (HOSPITAL_COMMUNITY): Payer: Self-pay | Admitting: Psychiatry

## 2012-01-05 MED ORDER — LAMOTRIGINE 150 MG PO TABS: 150.0000 mg | ORAL_TABLET | Freq: Every day | ORAL | Status: DC

## 2012-01-30 ENCOUNTER — Encounter (HOSPITAL_COMMUNITY): Payer: Self-pay | Admitting: Psychiatry

## 2012-01-30 ENCOUNTER — Ambulatory Visit (INDEPENDENT_AMBULATORY_CARE_PROVIDER_SITE_OTHER): Payer: 59 | Admitting: Psychiatry

## 2012-01-30 VITALS — BP 132/85 | Ht 61.25 in | Wt 211.0 lb

## 2012-01-30 DIAGNOSIS — F411 Generalized anxiety disorder: Secondary | ICD-10-CM

## 2012-01-30 DIAGNOSIS — F329 Major depressive disorder, single episode, unspecified: Secondary | ICD-10-CM

## 2012-01-30 NOTE — Progress Notes (Signed)
   [facility] Follow-up Outpatient Visit  Lindsey Guzman 11/22/1955   Subjective: The patient is a 56 year old female who has been followed by [facility] since April 2011. She is currently diagnosed with major depressive disorder along with generalized anxiety disorder. She has been doing well on her medications. The patient presents today with her husband. She has had medical problems. She collapsed at work, her legs when out from under her. They discovered her past potassium was low. When the past potassium was repeated, they rechecked her blood work and she had low hemoglobin. The patient had been having episodes of slurred speech and confusion. She's had some memory loss. She got a saw Dr. Luberta Robertson a neurologist who looked his muscle enzymes and an EMG. She was ruled out for ALS. She is following up with a neurologist on Monday to review an MRI. The patient continues to work. She's not driving. She is handling things appropriately. She stating that if she weren't on the medication that she was, she doesn't know how she would be doing. She endorses good sleep and appetite. We did review her medications, I do not see anything that would be causing any medical problems. She is on Topamax, but has been for migraines for a number of years without problems.  Filed Vitals:   01/30/12 1512  BP: 132/85    Mental Status Examination  Appearance: Casual Alert: Yes Attention: good  Cooperative: Yes Eye Contact: Good Speech: Regular rate rhythm and volume Psychomotor Activity: Normal Memory/Concentration: Intact Oriented: person, place, time/date and situation Mood: Euthymic Affect: Constricted Thought Processes and Associations: Logical Fund of Knowledge: Fair Thought Content: No suicidal or homicidal thoughts Insight: Fair Judgement: Fair  Diagnosis: Generalized anxiety disorder, major depressive disorder  Treatment Plan: We will not make any changes today. I  will see the patient back in 2 months. Patient to update with any progress from neurology. Lindsey Mead, MD

## 2012-02-04 ENCOUNTER — Telehealth (HOSPITAL_COMMUNITY): Payer: Self-pay

## 2012-02-11 ENCOUNTER — Other Ambulatory Visit (HOSPITAL_COMMUNITY): Payer: Self-pay | Admitting: Psychiatry

## 2012-03-31 ENCOUNTER — Encounter (HOSPITAL_COMMUNITY): Payer: Self-pay | Admitting: Psychiatry

## 2012-03-31 ENCOUNTER — Ambulatory Visit (INDEPENDENT_AMBULATORY_CARE_PROVIDER_SITE_OTHER): Payer: Self-pay | Admitting: Psychiatry

## 2012-03-31 VITALS — BP 142/110 | Ht 61.25 in | Wt 218.0 lb

## 2012-03-31 DIAGNOSIS — F329 Major depressive disorder, single episode, unspecified: Secondary | ICD-10-CM

## 2012-03-31 DIAGNOSIS — F411 Generalized anxiety disorder: Secondary | ICD-10-CM

## 2012-03-31 NOTE — Progress Notes (Signed)
   [facility] Follow-up Outpatient Visit  Lindsey Guzman 05-Apr-1956   Subjective: The patient is a 56 year old female who has been followed by [facility] since April 2011. She is currently diagnosed with major depressive disorder along with generalized anxiety disorder. At last appointment, we did not make any changes. The patient called on 02/04/2012 stating the neurology told her she did not have muscular sclerosis, ALS, or muscular dystrophy. She presents today with her husband. She had an MRI. 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 She has lost her job. They terminated her. She is very upset about this. Also her oldest dog there will found him to be put down at age 74. Her son is moving out. The patient cries throughout today's session. She will be having a sleep study for possible sleep apnea. Her primary care physician decreased her Topamax to have the dosage. The patient does  snore and gasps in her sleep. She is having a hard time dealing with all these changes. Her husband is very supportive. They're hoping to get a second opinion from neurology. Filed Vitals:   03/31/12 1537  BP: 142/110    Mental Status Examination  Appearance: Casual Alert: Yes Attention: good  Cooperative: Yes Eye Contact: Good Speech: Regular rate rhythm and volume Psychomotor Activity: Normal Memory/Concentration: Intact Oriented: person, place, time/date and situation Mood: Euthymic Affect: Constricted Thought Processes and Associations: Logical Fund of Knowledge: Fair Thought Content: No suicidal or homicidal thoughts Insight: Fair Judgement: Fair  Diagnosis: Generalized anxiety disorder, major depressive disorder  Treatment Plan: We will not make any changes today. I will see the patient back in 2 months. Patient to update with any progress from neurology or if she requires any referral to  [facility]. Christell Constant, Loma Messing, MD

## 2012-04-27 ENCOUNTER — Telehealth (HOSPITAL_COMMUNITY): Payer: Self-pay

## 2012-04-27 NOTE — Telephone Encounter (Signed)
Pt going through a lot of stuff right now would like to increase clonazepam to 4 times daily

## 2012-04-28 NOTE — Telephone Encounter (Signed)
We will make any changes right now. Patient has a "brain scan" pending. They're ruling out Hashimoto's thyroiditis. Patient to call with concerns.

## 2012-05-23 ENCOUNTER — Other Ambulatory Visit (HOSPITAL_COMMUNITY): Payer: Self-pay | Admitting: Psychiatry

## 2012-06-01 ENCOUNTER — Ambulatory Visit (INDEPENDENT_AMBULATORY_CARE_PROVIDER_SITE_OTHER): Payer: 59 | Admitting: Psychiatry

## 2012-06-01 ENCOUNTER — Encounter (HOSPITAL_COMMUNITY): Payer: Self-pay | Admitting: Psychiatry

## 2012-06-01 VITALS — BP 122/78 | Ht 61.25 in | Wt 225.0 lb

## 2012-06-01 DIAGNOSIS — F329 Major depressive disorder, single episode, unspecified: Secondary | ICD-10-CM

## 2012-06-01 DIAGNOSIS — F411 Generalized anxiety disorder: Secondary | ICD-10-CM

## 2012-06-01 NOTE — Progress Notes (Signed)
   [facility] Follow-up Outpatient Visit  Lindsey Guzman Sep 24, 1956   Subjective: The patient is a 56 year old female who has been followed by [facility] since April 2011. She is currently diagnosed with major depressive disorder along with generalized anxiety disorder. At her last appointment, I did not make any changes. Since that appointment, she has gone for a second opinion. An MRI showed some mild brain atrophy. An EEG showed slow waves on the right. Since her last appointment, her Topamax as been discontinued. Her Lamictal as been increased to 450 mg daily. Her neurologist will be taking it over. She does not have Hashimoto's thyroiditis, her thyroid studies were normal. The patient feels like she is doing pretty well. She still gets weepy occasionally and catastrophizes.  She is handling things much better than she would have several months ago.   Filed Vitals:   06/01/12 1451  BP: 122/78    Mental Status Examination  Appearance: Casual Alert: Yes Attention: good  Cooperative: Yes Eye Contact: Good Speech: Regular rate rhythm and volume Psychomotor Activity: Normal Memory/Concentration: Intact Oriented: person, place, time/date and situation Mood: Euthymic Affect: Constricted Thought Processes and Associations: Logical Fund of Knowledge: Fair Thought Content: No suicidal or homicidal thoughts Insight: Fair Judgement: Fair  Diagnosis: Generalized anxiety disorder, major depressive disorder  Treatment Plan: We will not make any changes today. I will see the patient back in 3 months   .Jamse Mead, MD

## 2012-07-12 ENCOUNTER — Other Ambulatory Visit (HOSPITAL_COMMUNITY): Payer: Self-pay

## 2012-07-12 DIAGNOSIS — F411 Generalized anxiety disorder: Secondary | ICD-10-CM

## 2012-07-12 MED ORDER — CLONAZEPAM 1 MG PO TABS: 1.0000 mg | ORAL_TABLET | Freq: Three times a day (TID) | ORAL | Status: DC

## 2012-07-12 NOTE — Telephone Encounter (Signed)
Must be mailed. Prescription printed.

## 2012-08-08 ENCOUNTER — Other Ambulatory Visit (HOSPITAL_COMMUNITY): Payer: Self-pay | Admitting: Psychiatry

## 2012-08-31 ENCOUNTER — Encounter (HOSPITAL_COMMUNITY): Payer: Self-pay | Admitting: Psychiatry

## 2012-08-31 ENCOUNTER — Ambulatory Visit (INDEPENDENT_AMBULATORY_CARE_PROVIDER_SITE_OTHER): Payer: 59 | Admitting: Psychiatry

## 2012-08-31 VITALS — BP 120/80 | Ht 61.25 in | Wt 227.0 lb

## 2012-08-31 DIAGNOSIS — F411 Generalized anxiety disorder: Secondary | ICD-10-CM

## 2012-08-31 DIAGNOSIS — F329 Major depressive disorder, single episode, unspecified: Secondary | ICD-10-CM

## 2012-08-31 DIAGNOSIS — F331 Major depressive disorder, recurrent, moderate: Secondary | ICD-10-CM

## 2012-08-31 NOTE — Progress Notes (Addendum)
   [facility] Follow-up Outpatient Visit  Lindsey Guzman [birth year]  The patient is a 56 year old female has been followed by [facility] since April 2011. She is currently diagnosed with Maj. depressive disorder along with generalized anxiety disorder. At her last appointment, she reported her MRI showed mild brain atrophy and an EEG showed slow waves on the right. Her neurologist increased her Lamictal to 450 mg daily and stop the Topamax. I did not make any changes for that appointment. She presents today with her husband. She is tested positive for sleep apnea. She had 22 spells during her sleep study. She will be started use a CPAP. She has a Neuro appointment on Thursday. She will be having an MRI with contrast for comparison to the previous MRI along with the mobile EEG at home for 48 hours. Patient reports her spirits are good. She is having rare crying spells. He usually these are related to something sad. She endorses good sleep and appetite. We discussed possibly coming also her medication, but I would like to wait until the studies are more definitive. Patient and husband agree.     Filed Vitals:   08/31/12 1109  BP: 120/80    Mental Status Examination  Appearance: Casual Alert: Yes Attention: good  Cooperative: Yes Eye Contact: Good Speech: Regular rate rhythm and volume Psychomotor Activity: Normal Memory/Concentration: Intact Oriented: person, place, time/date and situation Mood: Euthymic Affect: Constricted Thought Processes and Associations: Logical Fund of Knowledge: Fair Thought Content: No suicidal or homicidal thoughts Insight: Fair Judgement: Fair  Diagnosis: Generalized anxiety disorder, major depressive disorder  Treatment Plan: We will not make any changes today. I will see the patient back in 2 months. We may consider discontinuing Abilify at that time.   Marland KitchenJamse Mead, MD

## 2012-10-09 ENCOUNTER — Other Ambulatory Visit (HOSPITAL_COMMUNITY): Payer: Self-pay | Admitting: Psychiatry

## 2012-10-11 ENCOUNTER — Other Ambulatory Visit (HOSPITAL_COMMUNITY): Payer: Self-pay | Admitting: Psychiatry

## 2012-10-11 MED ORDER — ARIPIPRAZOLE 5 MG PO TABS: 5.0000 mg | ORAL_TABLET | Freq: Every day | ORAL | Status: DC

## 2012-11-01 ENCOUNTER — Encounter (HOSPITAL_COMMUNITY): Payer: Self-pay | Admitting: Psychiatry

## 2012-11-01 ENCOUNTER — Ambulatory Visit (INDEPENDENT_AMBULATORY_CARE_PROVIDER_SITE_OTHER): Payer: 59 | Admitting: Psychiatry

## 2012-11-01 VITALS — BP 118/72 | Ht 61.25 in | Wt 235.0 lb

## 2012-11-01 DIAGNOSIS — F331 Major depressive disorder, recurrent, moderate: Secondary | ICD-10-CM

## 2012-11-01 DIAGNOSIS — F411 Generalized anxiety disorder: Secondary | ICD-10-CM

## 2012-11-01 DIAGNOSIS — F339 Major depressive disorder, recurrent, unspecified: Secondary | ICD-10-CM

## 2012-11-01 NOTE — Progress Notes (Signed)
   [facility] Follow-up Outpatient Visit  IANA BUZAN December 08, 2055  The patient is a 57 year old female has been followed by [facility] since April 2011. She is currently diagnosed with Maj. depressive disorder along with generalized anxiety disorder. At her last appointment, I did not make any changes. She presents today with her husband. Since her last appointment, she has had a repeat MRI with contrast. This was normal. A repeat EEG which was actually a 48 hour continuous EEG showed central right hemisphere with abnormal connections. The patient is now wearing her CPAP at night. She is sleeping better, and her husband is sleeping better. She is now dreaming more. Her primary care physician is sending her to [facility] to look at weight loss. The patient has had less crying spells. Her mood has been stable. She has been taking her Klonopin standing 3 times a day. She would like to come off some medication.    Filed Vitals:   11/01/12 1042  BP: 118/72    Mental Status Examination  Appearance: Casual Alert: Yes Attention: good  Cooperative: Yes Eye Contact: Good Speech: Regular rate rhythm and volume Psychomotor Activity: Normal Memory/Concentration: Intact Oriented: person, place, time/date and situation Mood: Euthymic Affect: Constricted Thought Processes and Associations: Logical Fund of Knowledge: Fair Thought Content: No suicidal or homicidal thoughts Insight: Fair Judgement: Fair  Diagnosis: Generalized anxiety disorder, major depressive disorder  Treatment Plan: I will discontinue the Abilify. I will continue her Wellbutrin XL, Klonopin, and Zoloft. I will see her back in 6 weeks. At that time the plan would be to slowly taper her Klonopin. Any issues coming off the Abilify, patient is to call.  Marland KitchenJamse Mead, MD

## 2012-11-15 ENCOUNTER — Telehealth (HOSPITAL_COMMUNITY): Payer: Self-pay

## 2012-11-15 NOTE — Telephone Encounter (Signed)
No interaction that I'm aware of with her psychiatric meds.

## 2012-11-15 NOTE — Telephone Encounter (Signed)
Would like to know if phenterimine would interfere with any of her meds

## 2012-11-23 ENCOUNTER — Telehealth (HOSPITAL_COMMUNITY): Payer: Self-pay

## 2012-11-23 NOTE — Telephone Encounter (Signed)
Pt doing well of the clonazepam. Would like to know if she could wean off zoloft or buproprion.

## 2012-11-23 NOTE — Telephone Encounter (Signed)
Decrease Zoloft to 150 mg daily. Appointment on 17th. Update if issues prior to this.

## 2012-12-13 ENCOUNTER — Ambulatory Visit (HOSPITAL_COMMUNITY): Payer: Self-pay | Admitting: Psychiatry

## 2012-12-22 ENCOUNTER — Ambulatory Visit (INDEPENDENT_AMBULATORY_CARE_PROVIDER_SITE_OTHER): Payer: 59 | Admitting: Psychiatry

## 2012-12-22 ENCOUNTER — Encounter (HOSPITAL_COMMUNITY): Payer: Self-pay | Admitting: Psychiatry

## 2012-12-22 VITALS — BP 112/74 | Ht 61.25 in | Wt 222.0 lb

## 2012-12-22 DIAGNOSIS — F331 Major depressive disorder, recurrent, moderate: Secondary | ICD-10-CM

## 2012-12-22 DIAGNOSIS — F411 Generalized anxiety disorder: Secondary | ICD-10-CM

## 2012-12-22 DIAGNOSIS — F329 Major depressive disorder, single episode, unspecified: Secondary | ICD-10-CM

## 2012-12-22 MED ORDER — BUPROPION HCL ER (XL) 150 MG PO TB24: 150.0000 mg | ORAL_TABLET | ORAL | Status: DC

## 2012-12-22 MED ORDER — CLONAZEPAM 1 MG PO TABS: 1.0000 mg | ORAL_TABLET | Freq: Three times a day (TID) | ORAL | Status: DC

## 2012-12-22 NOTE — Progress Notes (Signed)
Lindsey Guzman  Lindsey Guzman 2056-06-18  The patient is a 57 year old female has been followed by [facility] since April 2011. She is currently diagnosed with Maj. depressive disorder along with generalized anxiety disorder. At her last appointment, I discontinued her Abilify. She called on 11/15/2012 to see a foot be okay if she took Pherteramine. I was fine with that. She called back on 11/23/2012 wanting to decrease Zoloft. I decreased at that point to 150 mg daily. She presents today with her husband. She is much clearer with a decrease in Abilify. She notices no difference being off it. After decreasing the Zoloft, she started waking up with crying spells. She went back up to 200 mg daily approximately one week ago. The patient is looking for work. 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 She is under a lot of stress because of it. She knows she will have to move, and hates to leave her 2 sons ages 66 and 50. Sleep is good. She has no motivation. She is still very depressed. She is asking if we can go up on Wellbutrin.   Filed Vitals:   12/22/12 1408  BP: 112/74   Active Ambulatory Problems    Diagnosis Date Noted  . GAD (generalized anxiety disorder) 10/30/2011  . MDD (major depressive disorder) 10/30/2011   Resolved Ambulatory Problems    Diagnosis Date Noted  . No Resolved Ambulatory Problems   Past Medical History  Diagnosis Date  . Anxiety   . Depression   . Hypercholesterolemia   . Gastroesophageal reflux disease   . Anemia   . Headache   . Hypertension    Current Outpatient Prescriptions on File Prior to Guzman  Medication Sig Dispense Refill  . buPROPion (WELLBUTRIN XL) 300 MG 24 hr tablet TAKE 1 TABLET DAILY  90 tablet  2  . CELEBREX 200 MG capsule       . gabapentin (NEURONTIN) 300 MG capsule       . hydrochlorothiazide (HYDRODIURIL) 25 MG tablet       . HYDROcodone-acetaminophen  (NORCO) 10-325 MG per tablet       . lamoTRIgine (LAMICTAL) 150 MG tablet Take 450 mg by mouth daily.      . methocarbamol (ROBAXIN) 500 MG tablet       . metroNIDAZOLE (FLAGYL) 500 MG tablet       . oxybutynin (DITROPAN-XL) 5 MG 24 hr tablet       . pantoprazole (PROTONIX) 40 MG tablet       . pravastatin (PRAVACHOL) 40 MG tablet       . propranolol ER (INDERAL LA) 80 MG 24 hr capsule       . sertraline (ZOLOFT) 100 MG tablet TAKE 2 TABLETS DAILY  180 tablet  1  . traMADol (ULTRAM) 50 MG tablet       . TRI-LEGEST FE 1-20/1-30/1-35 MG-MCG tablet        No current facility-administered medications on file prior to Guzman.   Review of Systems - General ROS: positive for  - fatigue Neurological ROS: positive for - behavioral changes, gait disturbance and impaired coordination/balance Mental Status Examination  Appearance: Casual Alert: Yes Attention: good  Cooperative: Yes Eye Contact: Good Speech: Regular rate rhythm and volume Psychomotor Activity: Normal Memory/Concentration: Intact Oriented: person, place, time/date and situation Mood: Euthymic Affect: Constricted Thought Processes and Associations: Logical Fund of Knowledge: Fair Thought Content: No suicidal or homicidal thoughts Insight: Fair  Judgement: Fair  Diagnosis: Generalized anxiety disorder, major depressive disorder  Treatment Plan: I will increase Wellbutrin to 450 mg daily. Risks, benefits, and side effects discussed. I will continue the Klonopin and the Zoloft at 200 mg daily. I will see the patient back in 5 weeks. Patient may call with concerns. Marland KitchenJamse Mead, MD

## 2013-01-04 ENCOUNTER — Other Ambulatory Visit (HOSPITAL_COMMUNITY): Payer: Self-pay | Admitting: Psychiatry

## 2013-01-04 DIAGNOSIS — F411 Generalized anxiety disorder: Secondary | ICD-10-CM

## 2013-01-04 MED ORDER — CLONAZEPAM 1 MG PO TABS: 1.0000 mg | ORAL_TABLET | Freq: Three times a day (TID) | ORAL | Status: DC

## 2013-01-06 ENCOUNTER — Telehealth (HOSPITAL_COMMUNITY): Payer: Self-pay

## 2013-01-06 NOTE — Telephone Encounter (Signed)
Will taper Wellbutrin XL.  Decrease to 300 mg x5 days then 150 mg x 5 days then d/c.

## 2013-01-10 ENCOUNTER — Telehealth (HOSPITAL_COMMUNITY): Payer: Self-pay

## 2013-01-10 NOTE — Telephone Encounter (Signed)
Ok

## 2013-01-14 ENCOUNTER — Other Ambulatory Visit (HOSPITAL_COMMUNITY): Payer: Self-pay | Admitting: Psychiatry

## 2013-01-17 ENCOUNTER — Telehealth (HOSPITAL_COMMUNITY): Payer: Self-pay | Admitting: Psychiatry

## 2013-01-17 NOTE — Telephone Encounter (Signed)
Called patient. She reports that she currently has 5 days of sertraline left. Asked her to call clinic if she did not received her medications by this Friday morning.

## 2013-01-27 ENCOUNTER — Other Ambulatory Visit (HOSPITAL_COMMUNITY): Payer: Self-pay | Admitting: Psychiatry

## 2013-01-27 ENCOUNTER — Encounter (HOSPITAL_COMMUNITY): Payer: Self-pay | Admitting: Psychiatry

## 2013-01-27 ENCOUNTER — Ambulatory Visit (INDEPENDENT_AMBULATORY_CARE_PROVIDER_SITE_OTHER): Payer: 59 | Admitting: Psychiatry

## 2013-01-27 VITALS — BP 122/84 | Ht 61.25 in | Wt 208.0 lb

## 2013-01-27 DIAGNOSIS — F331 Major depressive disorder, recurrent, moderate: Secondary | ICD-10-CM

## 2013-01-27 DIAGNOSIS — F329 Major depressive disorder, single episode, unspecified: Secondary | ICD-10-CM

## 2013-01-27 DIAGNOSIS — F411 Generalized anxiety disorder: Secondary | ICD-10-CM

## 2013-01-27 NOTE — Progress Notes (Signed)
[facility] Follow-up Outpatient Visit  Lindsey Guzman 07/18/2056  The patient is a 57 year old female has been followed by [facility] since April 2011. She is currently diagnosed with Maj. depressive disorder along with generalized anxiety disorder. At her last appointment, I increased her Wellbutrin XL to 450 mg daily. The patient called on 01/06/2013 stating that they she felt the Wellbutrin was making her stumbling words. She wanted to come off it. I plan to decrease then taper. The patient called back on 01/10/2013 stating that she was better on the 300 mg dose. She presents today with her husband. She is currently interviewing to work at [employer]. Their head office is in [place]. The job would be [place]. The patient is rather bored. She is sleeping a lot. She does not feel like doing chores. She's never played the role of housewife. Her anxiety is under control. She feels like her current medication dosages are controlling her anxiety and depression. Her husband feels as though she's doing well.   Filed Vitals:   01/27/13 1455  BP: 122/84   Active Ambulatory Problems    Diagnosis Date Noted  . GAD (generalized anxiety disorder) 10/30/2011  . MDD (major depressive disorder) 10/30/2011   Resolved Ambulatory Problems    Diagnosis Date Noted  . No Resolved Ambulatory Problems   Past Medical History  Diagnosis Date  . Anxiety   . Depression   . Hypercholesterolemia   . Gastroesophageal reflux disease   . Anemia   . Headache   . Hypertension    Current Outpatient Prescriptions on File Prior to Visit  Medication Sig Dispense Refill  . buPROPion (WELLBUTRIN XL) 150 MG 24 hr tablet Take 1 tablet (150 mg total) by mouth every morning. With 300 mg tablets for a total of 450 mg daily  30 tablet  2  . buPROPion (WELLBUTRIN XL) 300 MG 24 hr tablet TAKE 1 TABLET DAILY  90 tablet  2  . CELEBREX 200 MG capsule       . clonazePAM (KLONOPIN) 1 MG  tablet Take 1 tablet (1 mg total) by mouth 3 (three) times daily.  270 tablet  1  . gabapentin (NEURONTIN) 300 MG capsule       . hydrochlorothiazide (HYDRODIURIL) 25 MG tablet       . HYDROcodone-acetaminophen (NORCO) 10-325 MG per tablet       . lamoTRIgine (LAMICTAL) 150 MG tablet Take 450 mg by mouth daily.      . methocarbamol (ROBAXIN) 500 MG tablet       . metroNIDAZOLE (FLAGYL) 500 MG tablet       . oxybutynin (DITROPAN-XL) 5 MG 24 hr tablet       . pantoprazole (PROTONIX) 40 MG tablet       . pravastatin (PRAVACHOL) 40 MG tablet       . propranolol ER (INDERAL LA) 80 MG 24 hr capsule       . sertraline (ZOLOFT) 100 MG tablet TAKE 2 TABLETS DAILY  180 tablet  1  . traMADol (ULTRAM) 50 MG tablet       . TRI-LEGEST FE 1-20/1-30/1-35 MG-MCG tablet        No current facility-administered medications on file prior to visit.   Review of Systems - General ROS: positive for  - fatigue Neurological ROS: positive for - behavioral changes, gait disturbance and impaired coordination/balance Mental Status Examination  Appearance: Casual Alert: Yes Attention: good  Cooperative: Yes Eye Contact: Good  Speech: Regular rate rhythm and volume Psychomotor Activity: Normal Memory/Concentration: Intact Oriented: person, place, time/date and situation Mood: Euthymic Affect: Constricted Thought Processes and Associations: Logical Fund of Knowledge: Fair Thought Content: No suicidal or homicidal thoughts Insight: Fair Judgement: Fair  Diagnosis: Generalized anxiety disorder, major depressive disorder  Treatment Plan: I will continue the Zoloft at 200 mg daily, and the Wellbutrin 300 mg daily, and Klonopin. I will see the patient back in 3 months. I have given her the name of Dr. Erick Colace to arrange followup treatment if she gets the job in [place]. Marland KitchenJamse Mead, MD

## 2013-02-14 ENCOUNTER — Other Ambulatory Visit (HOSPITAL_COMMUNITY): Payer: Self-pay | Admitting: Psychiatry

## 2013-04-29 ENCOUNTER — Ambulatory Visit (INDEPENDENT_AMBULATORY_CARE_PROVIDER_SITE_OTHER): Payer: 59 | Admitting: Psychiatry

## 2013-04-29 ENCOUNTER — Encounter (HOSPITAL_COMMUNITY): Payer: Self-pay | Admitting: Psychiatry

## 2013-04-29 VITALS — BP 124/84 | Ht 61.25 in | Wt 193.0 lb

## 2013-04-29 DIAGNOSIS — F329 Major depressive disorder, single episode, unspecified: Secondary | ICD-10-CM

## 2013-04-29 DIAGNOSIS — F411 Generalized anxiety disorder: Secondary | ICD-10-CM

## 2013-04-29 DIAGNOSIS — F331 Major depressive disorder, recurrent, moderate: Secondary | ICD-10-CM

## 2013-04-29 NOTE — Progress Notes (Signed)
[facility] Follow-up Outpatient Visit  Lindsey Guzman [birth year]  The patient is a 57 year old female has been followed by [facility] since April 2011. She is currently diagnosed with Maj. depressive disorder along with generalized anxiety disorder. At her last appointment, I did not make any changes. She presents today with husband. The patient is down another 15 pounds. She reports she's not hungry. She's had very low energy and is tired a lot. She's not getting any exercise. Her legs continue to be week. She did not get the job at [employer]. After her interview she was on her way out the door, she tripped and fell. She had a hard time getting up. HR was notified and the patient was no longer considered for the position. The patient is considering getting a menial job just so she has something to do. Her husband is concerned about her being tired all the time. The patient's not taking in much food. She may have a protein shake each day. She denies any anxiety or depression. She simply has no motivation.   Filed Vitals:   04/29/13 1027  BP: 124/84   Active Ambulatory Problems    Diagnosis Date Noted  . GAD (generalized anxiety disorder) 10/30/2011  . MDD (major depressive disorder) 10/30/2011   Resolved Ambulatory Problems    Diagnosis Date Noted  . No Resolved Ambulatory Problems   Past Medical History  Diagnosis Date  . Anxiety   . Depression   . Hypercholesterolemia   . Gastroesophageal reflux disease   . Anemia   . Headache(784.0)   . Hypertension    Current Outpatient Prescriptions on File Prior to Visit  Medication Sig Dispense Refill  . buPROPion (WELLBUTRIN XL) 150 MG 24 hr tablet Take 1 tablet (150 mg total) by mouth every morning. With 300 mg tablets for a total of 450 mg daily  30 tablet  2  . buPROPion (WELLBUTRIN XL) 300 MG 24 hr tablet TAKE 1 TABLET DAILY  90 tablet  1  . CELEBREX 200 MG capsule       . clonazePAM (KLONOPIN) 1 MG tablet  Take 1 tablet (1 mg total) by mouth 3 (three) times daily.  270 tablet  1  . gabapentin (NEURONTIN) 300 MG capsule       . hydrochlorothiazide (HYDRODIURIL) 25 MG tablet       . HYDROcodone-acetaminophen (NORCO) 10-325 MG per tablet       . lamoTRIgine (LAMICTAL) 150 MG tablet Take 450 mg by mouth daily.      . methocarbamol (ROBAXIN) 500 MG tablet       . metroNIDAZOLE (FLAGYL) 500 MG tablet       . oxybutynin (DITROPAN-XL) 5 MG 24 hr tablet       . pantoprazole (PROTONIX) 40 MG tablet       . pravastatin (PRAVACHOL) 40 MG tablet       . propranolol ER (INDERAL LA) 80 MG 24 hr capsule       . sertraline (ZOLOFT) 100 MG tablet TAKE 2 TABLETS DAILY  180 tablet  1  . traMADol (ULTRAM) 50 MG tablet       . TRI-LEGEST FE 1-20/1-30/1-35 MG-MCG tablet        No current facility-administered medications on file prior to visit.   Review of Systems - General ROS: positive for  - fatigue Neurological ROS: positive for - behavioral changes, gait disturbance and impaired coordination/balance Mental Status Examination  Appearance: Casual Alert: Yes Attention:  good  Cooperative: Yes Eye Contact: Good Speech: Regular rate rhythm and volume Psychomotor Activity: Normal Memory/Concentration: Intact Oriented: person, place, time/date and situation Mood: Euthymic Affect: Constricted Thought Processes and Associations: Logical Fund of Knowledge: Fair Thought Content: No suicidal or homicidal thoughts Insight: Fair Judgement: Fair  Diagnosis: Generalized anxiety disorder, major depressive disorder  Treatment Plan: I will decrease Zoloft to 150 mg daily for 2 weeks then to 100 mg daily for 2 weeks. I will see the patient back in one month. I will continue her Klonopin and Wellbutrin XL. I will consider tapering the Klonopin at next appointment to see if it's causing over sedation.  Marland KitchenJamse Mead, MD

## 2013-06-02 ENCOUNTER — Ambulatory Visit (HOSPITAL_COMMUNITY): Payer: Self-pay | Admitting: Psychiatry

## 2013-06-06 ENCOUNTER — Encounter (HOSPITAL_COMMUNITY): Payer: Self-pay | Admitting: Psychiatry

## 2013-06-06 ENCOUNTER — Ambulatory Visit (INDEPENDENT_AMBULATORY_CARE_PROVIDER_SITE_OTHER): Payer: 59 | Admitting: Psychiatry

## 2013-06-06 VITALS — BP 122/82 | Ht 61.25 in | Wt 189.0 lb

## 2013-06-06 DIAGNOSIS — F329 Major depressive disorder, single episode, unspecified: Secondary | ICD-10-CM

## 2013-06-06 DIAGNOSIS — F331 Major depressive disorder, recurrent, moderate: Secondary | ICD-10-CM

## 2013-06-06 DIAGNOSIS — F411 Generalized anxiety disorder: Secondary | ICD-10-CM

## 2013-06-06 NOTE — Progress Notes (Signed)
[facility] Follow-up Outpatient Visit  Lindsey Guzman 2056/09/16  The patient is a 57 year old female has been followed by [facility] since April 2011. She is currently diagnosed with Maj. depressive disorder along with generalized anxiety disorder. At her last appointment, I began slowly decreasing her Zoloft to see if that was contributing to daytime sedation. She presents today with husband. She is down to 50 mg daily. The patient is down another 4 pounds. She is complaining of vivid dreams. She has had some bad ones, which she is afraid will come true. The patient continues to have issues with gait. She has been having trouble word finding. Her husband finds her a little more alert coming off the Zoloft. She is sleeping well at night, but still sleeping somewhat during the day. She will stay up and watch TV with him, whereas before she would not. She continues on Wellbutrin, Lamictal, and Klonopin. She does not feel any difference emotionally coming off the Zoloft. There is no suicidal thoughts. She is still having crying bouts. Her husband does see a little improvement.   Filed Vitals:   06/06/13 1427  BP: 122/82   Active Ambulatory Problems    Diagnosis Date Noted  . GAD (generalized anxiety disorder) 10/30/2011  . MDD (major depressive disorder) 10/30/2011   Resolved Ambulatory Problems    Diagnosis Date Noted  . No Resolved Ambulatory Problems   Past Medical History  Diagnosis Date  . Anxiety   . Depression   . Hypercholesterolemia   . Gastroesophageal reflux disease   . Anemia   . Headache(784.0)   . Hypertension    Current Outpatient Prescriptions on File Prior to Visit  Medication Sig Dispense Refill  . buPROPion (WELLBUTRIN XL) 150 MG 24 hr tablet Take 1 tablet (150 mg total) by mouth every morning. With 300 mg tablets for a total of 450 mg daily  30 tablet  2  . buPROPion (WELLBUTRIN XL) 300 MG 24 hr tablet TAKE 1 TABLET DAILY  90 tablet   1  . CELEBREX 200 MG capsule       . clonazePAM (KLONOPIN) 1 MG tablet Take 1 tablet (1 mg total) by mouth 3 (three) times daily.  270 tablet  1  . gabapentin (NEURONTIN) 300 MG capsule       . hydrochlorothiazide (HYDRODIURIL) 25 MG tablet       . HYDROcodone-acetaminophen (NORCO) 10-325 MG per tablet       . lamoTRIgine (LAMICTAL) 150 MG tablet Take 450 mg by mouth daily.      . methocarbamol (ROBAXIN) 500 MG tablet       . metroNIDAZOLE (FLAGYL) 500 MG tablet       . oxybutynin (DITROPAN-XL) 5 MG 24 hr tablet       . pantoprazole (PROTONIX) 40 MG tablet       . pravastatin (PRAVACHOL) 40 MG tablet       . propranolol ER (INDERAL LA) 80 MG 24 hr capsule       . sertraline (ZOLOFT) 100 MG tablet TAKE 2 TABLETS DAILY  180 tablet  1  . traMADol (ULTRAM) 50 MG tablet       . TRI-LEGEST FE 1-20/1-30/1-35 MG-MCG tablet        No current facility-administered medications on file prior to visit.   Review of Systems - General ROS: negative for - sleep disturbance or weight gain Psychological ROS: positive for - anxiety and depression Cardiovascular ROS: no chest pain or  dyspnea on exertion Musculoskeletal ROS: negative for - muscular weakness Neurological ROS: positive for - gait disturbance, impaired coordination/balance, memory loss, speech problems and weakness  Mental Status Examination  Appearance: Casual Alert: Yes Attention: good  Cooperative: Yes Eye Contact: Good Speech: Regular rate rhythm and volume Psychomotor Activity: Normal Memory/Concentration: Intact Oriented: person, place, time/date and situation Mood: Euthymic Affect: Constricted Thought Processes and Associations: Logical Fund of Knowledge: Fair Thought Content: No suicidal or homicidal thoughts Insight: Fair Judgement: Fair  Diagnosis: Generalized anxiety disorder, major depressive disorder  Treatment Plan: I will continue the Zoloft at 50 mg daily for 2 weeks then discontinue. I will see the patient back  in one month. I will continue her Wellbutrin XL 450 mg daily, Lamictal 450 mg daily, and Klonopin 1 mg 3 times a day. Patient or husband to call with concerns.  Marland KitchenJamse Mead, MD

## 2013-06-22 ENCOUNTER — Telehealth (HOSPITAL_COMMUNITY): Payer: Self-pay

## 2013-06-22 NOTE — Telephone Encounter (Signed)
PATIENT IS HAVING PANIC ATTACK AND WOULD LIKE A TRANQUILIZER. PT WAS TOLD TO GO TO HOSPITAL

## 2013-06-23 NOTE — Telephone Encounter (Signed)
Patient had a colonoscopy. She is waiting on results. She thinks she may have colon cancer. She will go for another week. She's panicking. I have advised her that she may use an extra Klonopin if necessary. Patient is to check in.

## 2013-06-28 ENCOUNTER — Telehealth (HOSPITAL_COMMUNITY): Payer: Self-pay

## 2013-06-28 MED ORDER — SERTRALINE HCL 50 MG PO TABS: ORAL_TABLET | ORAL | Status: DC

## 2013-06-28 NOTE — Telephone Encounter (Signed)
Returned call.  Off Zoloft.  Willing to go back on.  Start at 50 mg.

## 2013-07-06 ENCOUNTER — Telehealth (HOSPITAL_COMMUNITY): Payer: Self-pay

## 2013-07-07 ENCOUNTER — Encounter (HOSPITAL_COMMUNITY): Payer: Self-pay | Admitting: Psychiatry

## 2013-07-07 ENCOUNTER — Ambulatory Visit (INDEPENDENT_AMBULATORY_CARE_PROVIDER_SITE_OTHER): Payer: 59 | Admitting: Psychiatry

## 2013-07-07 ENCOUNTER — Encounter (INDEPENDENT_AMBULATORY_CARE_PROVIDER_SITE_OTHER): Payer: Self-pay

## 2013-07-07 VITALS — BP 128/78 | Ht 61.25 in | Wt 186.0 lb

## 2013-07-07 DIAGNOSIS — F411 Generalized anxiety disorder: Secondary | ICD-10-CM

## 2013-07-07 DIAGNOSIS — F329 Major depressive disorder, single episode, unspecified: Secondary | ICD-10-CM

## 2013-07-07 DIAGNOSIS — F331 Major depressive disorder, recurrent, moderate: Secondary | ICD-10-CM

## 2013-07-07 MED ORDER — BUPROPION HCL ER (XL) 150 MG PO TB24: 150.0000 mg | ORAL_TABLET | ORAL | Status: DC

## 2013-07-07 MED ORDER — SERTRALINE HCL 50 MG PO TABS: ORAL_TABLET | ORAL | Status: DC

## 2013-07-07 MED ORDER — BUPROPION HCL ER (XL) 300 MG PO TB24: ORAL_TABLET | ORAL | Status: DC

## 2013-07-07 MED ORDER — CLONAZEPAM 1 MG PO TABS: 1.0000 mg | ORAL_TABLET | Freq: Three times a day (TID) | ORAL | Status: DC

## 2013-07-07 NOTE — Progress Notes (Signed)
[facility] Follow-up Outpatient Visit  Lindsey Guzman [birth year]  The patient is a 57 year old female has been followed by [facility] since April 2011. She is currently diagnosed with Maj. depressive disorder along with generalized anxiety disorder. At her last appointment, I tapered and discontinued her Zoloft. I continued her Wellbutrin XL along with the Klonopin. The patient called on 06/22/2013. She was emotionally upset. She had had a colonoscopy and was scared that she had colon cancer. She would not find out for a week. I allowed her to use extra Klonopin at that time. The patient called again on 06/28/2013. She was more emotional having crying spells. She wanted to go back on Zoloft. At that time I restarted the Zoloft at 50 mg daily. She presents today. She saw her neurologist yesterday he diagnosed her with Hashimoto's encephalitis. She had an MRI done in the ED after a fall, which has not been reviewed by the neurologist. He diagnosed her on symptomology. The patient's colonoscopy was negative. The patient is feeling better. There is less crying. She reports she would not have stopped the Zoloft, but her husband wants her on less medication. When she had her blood drawn, she thought about becoming a phlebotomist. Her husband wants her to stay home and vacuum. She's not to vacuum and requires pain medication she does. The patient is going through some stress. They're downsizing their home and it is up for sale. She is is seeing Dr. Zola Button at [facility] next week for a second opinion of her neurological symptoms. The patient feels that emotionally she is doing better.   Filed Vitals:   07/07/13 1430  BP: 128/78   Active Ambulatory Problems    Diagnosis Date Noted  . GAD (generalized anxiety disorder) 10/30/2011  . MDD (major depressive disorder) 10/30/2011   Resolved Ambulatory Problems    Diagnosis Date Noted  . No Resolved  Ambulatory Problems   Past Medical History  Diagnosis Date  . Anxiety   . Depression   . Hypercholesterolemia   . Gastroesophageal reflux disease   . Anemia   . Headache(784.0)   . Hypertension    Current Outpatient Prescriptions on File Prior to Visit  Medication Sig Dispense Refill  . CELEBREX 200 MG capsule       . gabapentin (NEURONTIN) 300 MG capsule       . hydrochlorothiazide (HYDRODIURIL) 25 MG tablet       . HYDROcodone-acetaminophen (NORCO) 10-325 MG per tablet       . lamoTRIgine (LAMICTAL) 150 MG tablet Take 450 mg by mouth daily.      . methocarbamol (ROBAXIN) 500 MG tablet       . metroNIDAZOLE (FLAGYL) 500 MG tablet       . oxybutynin (DITROPAN-XL) 5 MG 24 hr tablet       . pantoprazole (PROTONIX) 40 MG tablet       . pravastatin (PRAVACHOL) 40 MG tablet       . propranolol ER (INDERAL LA) 80 MG 24 hr capsule       . traMADol (ULTRAM) 50 MG tablet       . TRI-LEGEST FE 1-20/1-30/1-35 MG-MCG tablet        No current facility-administered medications on file prior to visit.   Review of Systems - General ROS: negative for - sleep disturbance or weight gain Psychological ROS: positive for - anxiety and depression Cardiovascular ROS: no chest pain or dyspnea on exertion  Musculoskeletal ROS: negative for - muscular weakness Neurological ROS: positive for - gait disturbance, impaired coordination/balance, memory loss, speech problems and weakness  Mental Status Examination  Appearance: Casual Alert: Yes Attention: good  Cooperative: Yes Eye Contact: Good Speech: Regular rate rhythm and volume Psychomotor Activity: Normal Memory/Concentration: Intact Oriented: person, place, time/date and situation Mood: Euthymic Affect: Constricted Thought Processes and Associations: Logical Fund of Knowledge: Fair Thought Content: No suicidal or homicidal thoughts Insight: Fair Judgement: Fair  Diagnosis: Generalized anxiety disorder, major depressive  disorder  Treatment Plan: I will not make any changes. I will continue the Zoloft to 50 mg daily, the Wellbutrin XL at 450 mg daily along with the Klonopin 1 mg 3 times a day. The patient is still on Lamictal 450 mg daily through neurology. Patient will let me know the results of neurological consultation with Dr. Zola Button. The patient will return to clinic in 3 months to see Dr. Laury Deep. Marland KitchenJamse Mead, MD

## 2013-07-25 ENCOUNTER — Telehealth (HOSPITAL_COMMUNITY): Payer: Self-pay

## 2013-07-25 NOTE — Telephone Encounter (Signed)
Returned call

## 2013-07-25 NOTE — Telephone Encounter (Signed)
Off Zoloft.  Still on Klonapin and Wellbutrin XL.  Decrease WB Xl  By 150 mg x 3 days then disconinue.  Decrease Klonapin to bid x 3 days then daily x 3 days then d/c.

## 2013-07-25 NOTE — Telephone Encounter (Signed)
Pt has lost insurance and would like to be weaned off all medications

## 2013-10-07 ENCOUNTER — Ambulatory Visit (HOSPITAL_COMMUNITY): Payer: Self-pay | Admitting: Psychiatry

## 2014-02-01 ENCOUNTER — Ambulatory Visit (INDEPENDENT_AMBULATORY_CARE_PROVIDER_SITE_OTHER): Payer: 59

## 2014-02-01 ENCOUNTER — Ambulatory Visit (INDEPENDENT_AMBULATORY_CARE_PROVIDER_SITE_OTHER): Payer: 59 | Admitting: Podiatrist

## 2014-02-01 ENCOUNTER — Encounter: Payer: Self-pay | Admitting: Podiatrist

## 2014-02-01 VITALS — BP 110/72 | HR 75 | Resp 18

## 2014-02-01 DIAGNOSIS — M205X9 Other deformities of toe(s) (acquired), unspecified foot: Secondary | ICD-10-CM

## 2014-02-01 DIAGNOSIS — R52 Pain, unspecified: Secondary | ICD-10-CM

## 2014-02-01 DIAGNOSIS — M779 Enthesopathy, unspecified: Secondary | ICD-10-CM

## 2014-02-01 NOTE — Patient Instructions (Signed)
Hallux Rigidus Hallux rigidus is a condition involving pain and a loss of motion of the first (big) toe. The pain gets worse with lifting up (extension) of the toe. This is usually due to arthritic bony bumps (spurring) of the joint at the base of the big toe.  SYMPTOMS   Pain, with lifting up of the toe.  Tenderness over the joint where the big toe meets the foot.  Redness, swelling, and warmth over the top of the base of the big toe (sometimes).  Foot pain, stiffness, and limping. CAUSES  Halllux rigidus is caused by arthritis of the joint where the big toe meets the foot. The arthritis creates a bone spur that pinches the soft tissues, when the toe is extended. RISK INCREASES WITH:  Tight shoes, with a narrow toe box.  Family history of foot problems.  Gout and rheumatoid and psoriatic arthritis.  History of previous toe injury, including "turf toe."  Long first toe, flat feet, and other big toe bony bumps.  Arthritis of the big toe. PREVENTION   Wear wide toed shoes that fit well.  Tape the big toe, to reduce motion and to prevent pinching of the tissues between the bone.  Maintain physical fitness:  Foot and ankle flexibility.  Muscle strength and endurance. PROGNOSIS  This condition can usually be managed with proper treatment. However, surgery is typically required to prevent the problem from recurring.  RELATED COMPLICATIONS  Injury to other areas of the foot or ankle, caused by abnormal walking in an attempt to avoid the pain felt when walking normally. TREATMENT Treatment first involves stopping the activities that aggravate your symptoms. Ice and medicine can be used to reduce the pain and inflammation. Modifications to shoes may help reduce pain, including wearing stiff-soled shoes, shoes with a wide toe box, inserting a padded donut to relieve pressure on top of the joint, or wearing an arch support. Corticosteroid injections may be given to reduce inflammation.  If non-surgical treatment is unsuccessful, surgery may be needed. Surgical options include removing the arthritic bony spur, cutting a bone in the foot to change the arc of motion (allowing the toe to extend more), or fusion of the joint (eliminating all motion in the joint at the base of the big toe).  MEDICATION   If pain medicine is needed, nonsteroidal anti-inflammatory medicines (aspirin and ibuprofen), or other minor pain relievers (acetaminophen), are often advised.  Do not take pain medicine for 7 days before surgery.  Prescription pain relievers are usually prescribed only after surgery. Use only as directed and only as much as you need.  Ointments for arthritis, applied to the skin, may give some relief.  Injections of corticosteroids may be given to reduce inflammation. HEAT AND COLD  Cold treatment (icing) relieves pain and reduces inflammation. Cold treatment should be applied for 10 to 15 minutes every 2 to 3 hours, and immediately after activity that aggravates your symptoms. Use ice packs or an ice massage.  Heat treatment may be used before performing the stretching and strengthening activities prescribed by your caregiver, physical therapist, or athletic trainer. Use a heat pack or a warm water soak. SEEK MEDICAL CARE IF:   Symptoms get worse or do not improve in 2 weeks, despite treatment.  After surgery you develop fever, increasing pain, redness, swelling, drainage of fluids, bleeding, or increasing warmth.  New, unexplained symptoms develop. (Drugs used in treatment may produce side effects.) Document Released: 09/15/2005 Document Revised: 12/08/2011 Document Reviewed: 12/28/2008 [employer] Patient   Information 2014 [employer].  

## 2014-02-01 NOTE — Progress Notes (Signed)
   Subjective:    Patient ID: Lindsey Guzman, female    DOB: 05/16/1956, 58 y.o.   MRN: [id number]  HPI right foot has been hurting for about a year and worse the last 6 months and throb and big toe cramps up and I have a new job and standing and walking more and on my left foot there may be something there    Review of Systems  All other systems reviewed and are negative.      Objective:   Physical Exam Patient is awake, alert, and oriented x 3.  In no acute distress.  Neurovascular status is intact with palpable pedal pulses at 2/4 DP and PT bilateral and capillary refill time within normal limits. Neurological sensation is also intact bilaterally both epicritically and protectively. Dermatological exam reveals skin color, turger and texture as normal. No open lesions present.  She has discomfort range of motion of the first metatarsophalangeal joint right foot. Hallux limitus is seen on x-ray and clinically. Overall Musculature intact with dorsiflexion, plantarflexion, inversion, eversion. I'll discomfort on the left foot is also present.     Assessment & Plan:  Hallux limitus, capsulitis, pain right first metatarsophalangeal joint  Plan: Discussed orthotics to help with the discomfort with an offloading tension for the great toe joint. I also recommended an injection of which she agreed to. A sterile skin prep was applied and an injection was infiltrated into the area of maximal tenderness. She will be seen back for recheck.

## 2014-03-13 ENCOUNTER — Other Ambulatory Visit: Payer: 59

## 2015-03-06 DIAGNOSIS — M79673 Pain in unspecified foot: Secondary | ICD-10-CM

## 2016-02-05 DIAGNOSIS — F429 Obsessive-compulsive disorder, unspecified: Secondary | ICD-10-CM | POA: Insufficient documentation

## 2016-02-13 ENCOUNTER — Ambulatory Visit (INDEPENDENT_AMBULATORY_CARE_PROVIDER_SITE_OTHER): Payer: 59 | Admitting: Sports Medicine

## 2016-02-13 ENCOUNTER — Encounter: Payer: Self-pay | Admitting: Sports Medicine

## 2016-02-13 ENCOUNTER — Ambulatory Visit (INDEPENDENT_AMBULATORY_CARE_PROVIDER_SITE_OTHER): Payer: 59

## 2016-02-13 DIAGNOSIS — M79673 Pain in unspecified foot: Secondary | ICD-10-CM

## 2016-02-13 DIAGNOSIS — R51 Headache: Secondary | ICD-10-CM

## 2016-02-13 DIAGNOSIS — F419 Anxiety disorder, unspecified: Secondary | ICD-10-CM | POA: Insufficient documentation

## 2016-02-13 DIAGNOSIS — M2022 Hallux rigidus, left foot: Secondary | ICD-10-CM

## 2016-02-13 DIAGNOSIS — G4730 Sleep apnea, unspecified: Secondary | ICD-10-CM | POA: Insufficient documentation

## 2016-02-13 DIAGNOSIS — R519 Headache, unspecified: Secondary | ICD-10-CM | POA: Insufficient documentation

## 2016-02-13 DIAGNOSIS — G43909 Migraine, unspecified, not intractable, without status migrainosus: Secondary | ICD-10-CM | POA: Insufficient documentation

## 2016-02-13 DIAGNOSIS — R569 Unspecified convulsions: Secondary | ICD-10-CM | POA: Insufficient documentation

## 2016-02-13 DIAGNOSIS — F329 Major depressive disorder, single episode, unspecified: Secondary | ICD-10-CM | POA: Insufficient documentation

## 2016-02-13 DIAGNOSIS — I10 Essential (primary) hypertension: Secondary | ICD-10-CM | POA: Insufficient documentation

## 2016-02-13 DIAGNOSIS — M2021 Hallux rigidus, right foot: Secondary | ICD-10-CM

## 2016-02-13 DIAGNOSIS — F0280 Dementia in other diseases classified elsewhere without behavioral disturbance: Secondary | ICD-10-CM | POA: Insufficient documentation

## 2016-02-13 DIAGNOSIS — E7800 Pure hypercholesterolemia, unspecified: Secondary | ICD-10-CM | POA: Insufficient documentation

## 2016-02-13 DIAGNOSIS — F339 Major depressive disorder, recurrent, unspecified: Secondary | ICD-10-CM | POA: Insufficient documentation

## 2016-02-13 DIAGNOSIS — M5417 Radiculopathy, lumbosacral region: Secondary | ICD-10-CM | POA: Diagnosis not present

## 2016-02-13 DIAGNOSIS — M4310 Spondylolisthesis, site unspecified: Secondary | ICD-10-CM | POA: Insufficient documentation

## 2016-02-13 DIAGNOSIS — T819XXA Unspecified complication of procedure, initial encounter: Secondary | ICD-10-CM | POA: Insufficient documentation

## 2016-02-13 DIAGNOSIS — G629 Polyneuropathy, unspecified: Secondary | ICD-10-CM | POA: Insufficient documentation

## 2016-02-13 DIAGNOSIS — F32A Depression, unspecified: Secondary | ICD-10-CM | POA: Insufficient documentation

## 2016-02-13 DIAGNOSIS — G300 Alzheimer's disease with early onset: Secondary | ICD-10-CM

## 2016-02-13 DIAGNOSIS — K219 Gastro-esophageal reflux disease without esophagitis: Secondary | ICD-10-CM | POA: Insufficient documentation

## 2016-02-13 DIAGNOSIS — M48061 Spinal stenosis, lumbar region without neurogenic claudication: Secondary | ICD-10-CM | POA: Insufficient documentation

## 2016-02-13 DIAGNOSIS — G4733 Obstructive sleep apnea (adult) (pediatric): Secondary | ICD-10-CM | POA: Insufficient documentation

## 2016-02-13 HISTORY — DX: Headache, unspecified: R51.9

## 2016-02-13 MED ORDER — TRIAMCINOLONE ACETONIDE 10 MG/ML IJ SUSP: 10.0000 mg | Freq: Once | INTRAMUSCULAR | Status: AC

## 2016-02-13 NOTE — Progress Notes (Signed)
Patient ID: Lindsey Guzman, female   DOB: 01/19/1956, 60 y.o.   MRN: [id number] Subjective: Lindsey Guzman is a 60 y.o. female patient who presents to office for evaluation of Right< Left big toe joint pain. Patient complains of constant pain in both the toe joint feels achy in nature. Also reports that she has numbness to the balls of both feet filmlike there is something bunched up underneath her toes. Reports that she has a significant history of back problems with undergoing back surgery that has improved some of the sensations to both feet, however, some residual symptoms still remain. Patient also admits now on yesterday. She fell and her left second and third toes bent back. Denies any bruising or other injury. Patient denies any other pedal complaints.   Patient Active Problem List   Diagnosis Date Noted  . Anxiety 02/13/2016  . Clinical depression 02/13/2016  . Alzheimer's disease with early onset 02/13/2016  . Acid reflux 02/13/2016  . Cephalalgia 02/13/2016  . Hypercholesterolemia 02/13/2016  . BP (high blood pressure) 02/13/2016  . Headache, migraine 02/13/2016  . Neuropathy (HCC) 02/13/2016  . Complication of surgical procedure 02/13/2016  . Recurrent major depressive disorder (HCC) 02/13/2016  . Seizure (HCC) 02/13/2016  . Apnea, sleep 02/13/2016  . Lumbar canal stenosis 02/13/2016  . SPL (spondylolisthesis) 02/13/2016  . Obsessive-compulsive disorder 02/05/2016  . GAD (generalized anxiety disorder) 10/30/2011  . MDD (major depressive disorder) (HCC) 10/30/2011    Current Outpatient Prescriptions on File Prior to Visit  Medication Sig Dispense Refill  . buPROPion (WELLBUTRIN XL) 150 MG 24 hr tablet Take 1 tablet (150 mg total) by mouth every morning. With 300 mg tablets for a total of 450 mg daily 90 tablet 1  . buPROPion (WELLBUTRIN XL) 300 MG 24 hr tablet TAKE 1 TABLET DAILY 90 tablet 1  . calcium carbonate (OS-CAL) 600 MG TABS tablet Take 600 mg by mouth 2 (two) times  daily with a meal.    . CELEBREX 200 MG capsule     . clonazePAM (KLONOPIN) 1 MG tablet Take 1 tablet (1 mg total) by mouth 3 (three) times daily. 270 tablet 1  . gabapentin (NEURONTIN) 300 MG capsule     . hydrochlorothiazide (HYDRODIURIL) 25 MG tablet     . HYDROcodone-acetaminophen (NORCO) 10-325 MG per tablet     . lamoTRIgine (LAMICTAL) 150 MG tablet Take 450 mg by mouth daily.    . methocarbamol (ROBAXIN) 500 MG tablet     . metroNIDAZOLE (FLAGYL) 500 MG tablet     . Multiple Vitamins-Minerals (CENTRUM SILVER PO) Take by mouth.    . oxybutynin (DITROPAN-XL) 5 MG 24 hr tablet     . pantoprazole (PROTONIX) 40 MG tablet     . pravastatin (PRAVACHOL) 40 MG tablet     . propranolol ER (INDERAL LA) 80 MG 24 hr capsule     . sertraline (ZOLOFT) 50 MG tablet TAKE1 TABLET DAILY 90 tablet 1  . SUMAtriptan (IMITREX) 100 MG tablet     . topiramate (TOPAMAX) 50 MG tablet Take 50 mg by mouth 2 (two) times daily.    . traMADol (ULTRAM) 50 MG tablet     . TRI-LEGEST FE 1-20/1-30/1-35 MG-MCG tablet      No current facility-administered medications on file prior to visit.    No Known Allergies  Objective:  General: Alert and oriented x3 in no acute distress  Dermatology: No open lesions bilateral lower extremities, no webspace macerations, no ecchymosis bilateral, all nails x  10 are well manicured.  Vascular: Dorsalis Pedis and Posterior Tibial pedal pulses 2/4, Capillary Fill Time 3 seconds, Scant pedal hair growth bilateral, no edema bilateral lower extremities, Temperature gradient within normal limits.  Neurology: Gross sensation intact via light touch bilateral, Protective sensation intact  with Semmes Weinstein Monofilament to all pedal sites, Position sense intact, vibratory intact bilateral, Deep tendon reflexes within normal limits bilateral, No babinski sign present bilateral. (-) Tinels sign bilateral. Subjective numbness and bunched up sock feeling underneath ball of both feet.    Musculoskeletal: Mild tenderness with palpation left greater than right first metatarsophalangeal joint with limitation and mild crepitus with range of motion, suggestive of hallux limitus versus ongoing rigidus noted bilateral. Midtarsal, Subtalar joint, and ankle joint range of motion is within normal limits. Pes planus foot type. Strength within normal limits.   Xrays  Right and Left Foot    Impression: Normal osseous mineralization. There is calcaneal spur that is small in nature with no fracture or dislocation. Midtarsal breach, suggestive of pes planus. There is significant first metatarsophalangeal joint space narrowing with arthritic changes suggestive of hallux limitus/rigidus. Soft tissue margins within normal limits. No foreign body.      Assessment and Plan: Problem List Items Addressed This Visit    None    Visit Diagnoses    Foot pain, unspecified laterality    -  Primary    Relevant Orders    DG Foot 2 Views Left    DG Foot 2 Views Right    Hallux rigidus of both feet        Relevant Medications    triamcinolone acetonide (KENALOG) 10 MG/ML injection 10 mg    Neuropathy, lumbosacral (radicular)        History of back surgery 2016 and disc herniation       -Complete examination performed -Xrays reviewed -Discussed treatement options; discussed Hallux limitus/rigidus deformity with neuropathy secondary to back surgery;conservative and Surgical management; risks, benefits, alternatives discussed. All patient's questions answered. -After verbal consent, injected into right and left 1st MTPJ for symptomatic relief 1cc lidocaine plain, 1cc marcaine plain, 0.5cc dexamethasone without complication.   -Applied removable metatarsal pad strapping to both feet -Recommend continue with good supportive shoes and inserts daily.  -Recommend Epsom salt soaks followed by ice as needed and gentle stretching exercises consisting of towel curls or marble pickup. -Patient to return to  office in 1 month for reevaluation or sooner if condition worsens.  Asencion Islam, DPM

## 2016-02-13 NOTE — Patient Instructions (Addendum)
Daily stretching exercises of the toes using marbles or a towel  Soak with Epsom salt followed by icing the area for 15 mins as needed for achy pain   Recommend good supportive shoes daily and wearing removal met pad daily while ambulating/walking. May take pads off to sleep and shower  Return in 1 month for follow up evaluation   Hallux Rigidus Hallux rigidus is a condition involving pain and a loss of motion of the first (big) toe. The pain gets worse with lifting up (extension) of the toe. This is usually due to arthritic bony bumps (spurring) of the joint at the base of the big toe.  SYMPTOMS   Pain, with lifting up of the toe.  Tenderness over the joint where the big toe meets the foot.  Redness, swelling, and warmth over the top of the base of the big toe (sometimes).  Foot pain, stiffness, and limping. CAUSES  Hallux rigidus is caused by arthritis of the joint where the big toe meets the foot. The arthritis creates a bone spur that pinches the soft tissues when the toe is extended. RISK INCREASES WITH:  Tight shoes with a narrow toe box.  Family history of foot problems.  Gout and rheumatoid and psoriatic arthritis.  History of previous toe injury, including "turf toe."  Long first toe, flat feet, and other big toe bony bumps.  Arthritis of the big toe. PREVENTION   Wear wide-toed shoes that fit well.  Tape the big toe to reduce motion and to prevent pinching of the tissues between the bone.  Maintain physical fitness:  Foot and ankle flexibility.  Muscle strength and endurance. PROGNOSIS  This condition can usually be managed with proper treatment. However, surgery is typically required to prevent the problem from recurring.  RELATED COMPLICATIONS  Injury to other areas of the foot or ankle, caused by abnormal walking in an attempt to avoid the pain felt when walking normally. TREATMENT Treatment first involves stopping the activities that aggravate your  symptoms. Ice and medicine can be used to reduce the pain and inflammation. Modifications to shoes may help reduce pain, including wearing stiff-soled shoes, shoes with a wide toe box, inserting a padded donut to relieve pressure on top of the joint, or wearing an arch support. Corticosteroid injections may be given to reduce inflammation. If nonsurgical treatment is unsuccessful, surgery may be needed. Surgical options include removing the arthritic bony spur, cutting a bone in the foot to change the arc of motion (allowing the toe to extend more), or fusion of the joint (eliminating all motion in the joint at the base of the big toe).  MEDICATION   If pain medicine is needed, nonsteroidal anti-inflammatory medicines (aspirin and ibuprofen), or other minor pain relievers (acetaminophen), are often advised.  Do not take pain medicine for 7 days before surgery.  Prescription pain relievers are usually prescribed only after surgery. Use only as directed and only as much as you need.  Ointments for arthritis, applied to the skin, may give some relief.  Injections of corticosteroids may be given to reduce inflammation. HEAT AND COLD  Cold treatment (icing) relieves pain and reduces inflammation. Cold treatment should be applied for 10 to 15 minutes every 2 to 3 hours, and immediately after activity that aggravates your symptoms. Use ice packs or an ice massage.  Heat treatment may be used before performing the stretching and strengthening activities prescribed by your caregiver, physical therapist, or athletic trainer. Use a heat pack or a  warm water soak. SEEK MEDICAL CARE IF:   Symptoms get worse or do not improve in 2 weeks, despite treatment.  After surgery you develop fever, increasing pain, redness, swelling, drainage of fluids, bleeding, or increasing warmth.  New, unexplained symptoms develop. (Drugs used in treatment may produce side effects.)   This information is not intended to  replace advice given to you by your health care provider. Make sure you discuss any questions you have with your health care provider.   Document Released: 09/15/2005 Document Revised: 10/06/2014 Document Reviewed: 12/28/2008 Elsevier Interactive Patient Education [employer].

## 2016-02-26 DIAGNOSIS — Z86010 Personal history of colonic polyps: Secondary | ICD-10-CM | POA: Insufficient documentation

## 2016-02-26 DIAGNOSIS — Z860101 Personal history of adenomatous and serrated colon polyps: Secondary | ICD-10-CM | POA: Insufficient documentation

## 2016-03-27 ENCOUNTER — Ambulatory Visit (INDEPENDENT_AMBULATORY_CARE_PROVIDER_SITE_OTHER): Payer: 59 | Admitting: Sports Medicine

## 2016-03-27 ENCOUNTER — Encounter: Payer: Self-pay | Admitting: Sports Medicine

## 2016-03-27 DIAGNOSIS — M79673 Pain in unspecified foot: Secondary | ICD-10-CM

## 2016-03-27 DIAGNOSIS — M5417 Radiculopathy, lumbosacral region: Secondary | ICD-10-CM

## 2016-03-27 DIAGNOSIS — M2021 Hallux rigidus, right foot: Secondary | ICD-10-CM | POA: Diagnosis not present

## 2016-03-27 DIAGNOSIS — M2022 Hallux rigidus, left foot: Secondary | ICD-10-CM

## 2016-03-27 NOTE — Progress Notes (Signed)
Patient ID: Marlaine HindGail A Bryngelson, female   DOB: 04/07/1956, 60 y.o.   MRN: [id number]  Subjective: Marlaine HindGail A Scardina is a 60 y.o. female patient who presents to office for evaluation of Right> Left big toe joint pain. Patient states that the injection helped for about 2 weeks before her symptoms start to recur most so on the right than the left. States that she has been doing stretching exercises as instructed and has noticed a difference of more stiffness when she does not do her stretching. States that the removable metatarsal pad. Work well until over time began to wear become loose. Patient denies any other pedal complaints.   Patient Active Problem List   Diagnosis Date Noted  . H/O adenomatous polyp of colon 02/26/2016  . Anxiety 02/13/2016  . Clinical depression 02/13/2016  . Alzheimer's disease with early onset 02/13/2016  . Acid reflux 02/13/2016  . Cephalalgia 02/13/2016  . Hypercholesterolemia 02/13/2016  . BP (high blood pressure) 02/13/2016  . Headache, migraine 02/13/2016  . Neuropathy (HCC) 02/13/2016  . Complication of surgical procedure 02/13/2016  . Recurrent major depressive disorder (HCC) 02/13/2016  . Seizure (HCC) 02/13/2016  . Apnea, sleep 02/13/2016  . Lumbar canal stenosis 02/13/2016  . SPL (spondylolisthesis) 02/13/2016  . Obsessive-compulsive disorder 02/05/2016  . GAD (generalized anxiety disorder) 10/30/2011  . MDD (major depressive disorder) (HCC) 10/30/2011    Current Outpatient Prescriptions on File Prior to Visit  Medication Sig Dispense Refill  . buPROPion (WELLBUTRIN XL) 150 MG 24 hr tablet Take 1 tablet (150 mg total) by mouth every morning. With 300 mg tablets for a total of 450 mg daily 90 tablet 1  . buPROPion (WELLBUTRIN XL) 300 MG 24 hr tablet TAKE 1 TABLET DAILY 90 tablet 1  . calcium carbonate (OS-CAL) 600 MG TABS tablet Take 600 mg by mouth 2 (two) times daily with a meal.    . CELEBREX 200 MG capsule     . clonazePAM (KLONOPIN) 1 MG tablet Take 1  tablet (1 mg total) by mouth 3 (three) times daily. 270 tablet 1  . gabapentin (NEURONTIN) 300 MG capsule     . hydrochlorothiazide (HYDRODIURIL) 25 MG tablet     . HYDROcodone-acetaminophen (NORCO) 10-325 MG per tablet     . lamoTRIgine (LAMICTAL) 150 MG tablet Take 450 mg by mouth daily.    . methocarbamol (ROBAXIN) 500 MG tablet     . metroNIDAZOLE (FLAGYL) 500 MG tablet     . Multiple Vitamins-Minerals (CENTRUM SILVER PO) Take by mouth.    . oxybutynin (DITROPAN-XL) 5 MG 24 hr tablet     . pantoprazole (PROTONIX) 40 MG tablet     . pravastatin (PRAVACHOL) 40 MG tablet     . propranolol ER (INDERAL LA) 80 MG 24 hr capsule     . sertraline (ZOLOFT) 50 MG tablet TAKE1 TABLET DAILY 90 tablet 1  . SUMAtriptan (IMITREX) 100 MG tablet     . topiramate (TOPAMAX) 50 MG tablet Take 50 mg by mouth 2 (two) times daily.    . traMADol (ULTRAM) 50 MG tablet     . TRI-LEGEST FE 1-20/1-30/1-35 MG-MCG tablet      Current Facility-Administered Medications on File Prior to Visit  Medication Dose Route Frequency Provider Last Rate Last Dose  . triamcinolone acetonide (KENALOG) 10 MG/ML injection 10 mg  10 mg Other Once [employer] Elmarie Devlin, DPM        No Known Allergies  Objective:  General: Alert and oriented x3 in no acute distress  Dermatology: No open lesions bilateral lower extremities, no webspace macerations, no ecchymosis bilateral, all nails x 10 are well manicured.  Vascular: Dorsalis Pedis and Posterior Tibial pedal pulses 2/4, Capillary Fill Time 3 seconds, Scant pedal hair growth bilateral, no edema bilateral lower extremities, Temperature gradient within normal limits.  Neurology: Gross sensation intact via light touch bilateral, Protective sensation intact  with Semmes Weinstein Monofilament to all pedal sites, Position sense intact, vibratory intact bilateral, Deep tendon reflexes within normal limits bilateral, No babinski sign present bilateral. (-) Tinels sign bilateral. Subjective  numbness and bunched up sock feeling underneath ball of both feet.   Musculoskeletal: Mild tenderness with palpation, Right greater than left first metatarsophalangeal joint with limitation and mild crepitus with range of motion, suggestive of hallux limitus versus ongoing rigidus noted bilateral with metatarsalgia and distal fat pad migration. Midtarsal, Subtalar joint, and ankle joint range of motion is within normal limits. Pes planus foot type. Strength within normal limits.   Assessment and Plan: Problem List Items Addressed This Visit    None    Visit Diagnoses    Foot pain, unspecified laterality    -  Primary    Hallux rigidus of both feet        Neuropathy, lumbosacral (radicular)           -Complete examination performed -Discussed treatement options; discussed Hallux limitus/rigidus deformity with neuropathy secondary to back surgery;conservative and Surgical management; risks, benefits, alternatives discussed. All patient's questions answered. -Due to minimal symptoms. No reinjection administered at this time to bilateral first metatarsal phalangeal joints. -Applied removable metatarsal pad strapping to both feet and instructed patient on use -Recommend continue with good supportive shoes and inserts daily.  -Recommend continue with Epsom salt soaks followed by ice as needed and gentle stretching exercises consisting of towel curls or marble pickup. -Patient to return to office as needed or sooner if condition worsens. Discussed with patient, May consider adding anti-inflammatories once her back condition has improved. Patient reports that she is to go get the herniated disc checked and has had 2 epidurals and cannot have any anti-inflammatories at this time.   Asencion Islamitorya Shaquavia Whisonant, DPM

## 2017-09-29 DIAGNOSIS — G4733 Obstructive sleep apnea (adult) (pediatric): Secondary | ICD-10-CM

## 2017-09-29 DIAGNOSIS — E66812 Obesity, class 2: Secondary | ICD-10-CM

## 2017-09-29 HISTORY — DX: Morbid (severe) obesity due to excess calories: E66.01

## 2017-09-29 HISTORY — DX: Obesity, class 2: E66.812

## 2017-09-29 HISTORY — DX: Obstructive sleep apnea (adult) (pediatric): G47.33

## 2017-12-23 DIAGNOSIS — M2142 Flat foot [pes planus] (acquired), left foot: Secondary | ICD-10-CM

## 2017-12-23 DIAGNOSIS — M76822 Posterior tibial tendinitis, left leg: Secondary | ICD-10-CM

## 2017-12-23 HISTORY — DX: Posterior tibial tendinitis, left leg: M76.822

## 2017-12-23 HISTORY — DX: Flat foot (pes planus) (acquired), left foot: M21.42

## 2018-01-19 DIAGNOSIS — M533 Sacrococcygeal disorders, not elsewhere classified: Secondary | ICD-10-CM

## 2018-01-19 HISTORY — DX: Sacrococcygeal disorders, not elsewhere classified: M53.3

## 2018-02-25 DIAGNOSIS — M84372A Stress fracture, left ankle, initial encounter for fracture: Secondary | ICD-10-CM

## 2018-02-25 HISTORY — DX: Stress fracture, left ankle, initial encounter for fracture: M84.372A

## 2018-05-28 DIAGNOSIS — M66872 Spontaneous rupture of other tendons, left ankle and foot: Secondary | ICD-10-CM

## 2018-05-28 HISTORY — DX: Spontaneous rupture of other tendons, left ankle and foot: M66.872

## 2018-11-12 LAB — LIPID PANEL
Cholesterol: 228 — AB (ref 0–200)
HDL: 82 — AB (ref 35–70)
LDL Cholesterol: 128
Triglycerides: 89 (ref 40–160)

## 2018-11-13 LAB — TSH: TSH: 1.47 (ref 0.41–5.90)

## 2018-11-16 LAB — IRON,TIBC AND FERRITIN PANEL
%SAT: 31
Iron: 84
TIBC: 268
UIBC: 184

## 2018-12-06 LAB — BASIC METABOLIC PANEL
BUN: 12 (ref 4–21)
CO2: 25 — AB (ref 13–22)
Chloride: 114 — AB (ref 99–108)
Creatinine: 1 (ref 0.5–1.1)
Glucose: 109
Potassium: 3.5 (ref 3.4–5.3)
Sodium: 144 (ref 137–147)

## 2018-12-06 LAB — COMPREHENSIVE METABOLIC PANEL
Calcium: 8.7 (ref 8.7–10.7)
GFR calc non Af Amer: 62

## 2019-05-28 LAB — LIPID PANEL
Cholesterol: 219 — AB (ref 0–200)
HDL: 64 (ref 35–70)
LDL Cholesterol: 126
Triglycerides: 143 (ref 40–160)

## 2019-05-28 LAB — VITAMIN B12: Vitamin B-12: 2000

## 2019-05-28 LAB — VITAMIN D 25 HYDROXY (VIT D DEFICIENCY, FRACTURES): Vit D, 25-Hydroxy: 51

## 2019-12-09 DIAGNOSIS — G8929 Other chronic pain: Secondary | ICD-10-CM

## 2019-12-09 DIAGNOSIS — N3281 Overactive bladder: Secondary | ICD-10-CM

## 2019-12-09 DIAGNOSIS — M549 Dorsalgia, unspecified: Secondary | ICD-10-CM

## 2019-12-09 DIAGNOSIS — R7989 Other specified abnormal findings of blood chemistry: Secondary | ICD-10-CM

## 2019-12-09 DIAGNOSIS — E215 Disorder of parathyroid gland, unspecified: Secondary | ICD-10-CM

## 2019-12-09 HISTORY — DX: Disorder of parathyroid gland, unspecified: E21.5

## 2019-12-09 HISTORY — DX: Dorsalgia, unspecified: M54.9

## 2019-12-09 HISTORY — DX: Other chronic pain: G89.29

## 2019-12-09 HISTORY — DX: Overactive bladder: N32.81

## 2019-12-09 HISTORY — DX: Other specified abnormal findings of blood chemistry: R79.89

## 2020-01-30 DIAGNOSIS — N1831 Chronic kidney disease, stage 3a: Secondary | ICD-10-CM

## 2020-01-30 DIAGNOSIS — M659 Synovitis and tenosynovitis, unspecified: Secondary | ICD-10-CM

## 2020-01-30 DIAGNOSIS — M65972 Unspecified synovitis and tenosynovitis, left ankle and foot: Secondary | ICD-10-CM

## 2020-01-30 HISTORY — DX: Unspecified synovitis and tenosynovitis, left ankle and foot: M65.972

## 2020-01-30 HISTORY — DX: Synovitis and tenosynovitis, unspecified: M65.9

## 2020-01-30 HISTORY — DX: Chronic kidney disease, stage 3a: N18.31

## 2020-05-07 LAB — COMPREHENSIVE METABOLIC PANEL
Albumin: 4 (ref 3.5–5.0)
Calcium: 9.5 (ref 8.7–10.7)
GFR calc non Af Amer: 59

## 2020-05-07 LAB — BASIC METABOLIC PANEL
BUN: 15 (ref 4–21)
CO2: 27 — AB (ref 13–22)
Chloride: 106 (ref 99–108)
Creatinine: 1 (ref 0.5–1.1)
Glucose: 97
Potassium: 3.2 — AB (ref 3.4–5.3)
Sodium: 139 (ref 137–147)

## 2020-05-07 LAB — HEPATIC FUNCTION PANEL
ALT: 9 (ref 7–35)
AST: 13 (ref 13–35)
Alkaline Phosphatase: 83 (ref 25–125)
Bilirubin, Total: 0.4

## 2020-05-14 DIAGNOSIS — M4725 Other spondylosis with radiculopathy, thoracolumbar region: Secondary | ICD-10-CM

## 2020-05-14 HISTORY — DX: Other spondylosis with radiculopathy, thoracolumbar region: M47.25

## 2020-05-15 LAB — COMPREHENSIVE METABOLIC PANEL
Albumin: 4 (ref 3.5–5.0)
Calcium: 9.5 (ref 8.7–10.7)
GFR calc non Af Amer: 61

## 2020-05-15 LAB — BASIC METABOLIC PANEL
BUN: 13 (ref 4–21)
CO2: 22 (ref 13–22)
Chloride: 103 (ref 99–108)
Creatinine: 1 (ref 0.5–1.1)
Glucose: 154
Potassium: 3.7 (ref 3.4–5.3)
Sodium: 133 — AB (ref 137–147)

## 2020-05-15 LAB — HEPATIC FUNCTION PANEL
ALT: 41 — AB (ref 7–35)
AST: 45 — AB (ref 13–35)
Alkaline Phosphatase: 77 (ref 25–125)
Bilirubin, Total: 0.4

## 2020-05-15 LAB — CBC: RBC: 2.92 — AB (ref 3.87–5.11)

## 2020-05-15 LAB — CBC AND DIFFERENTIAL
HCT: 30 — AB (ref 36–46)
Hemoglobin: 10.1 — AB (ref 12.0–16.0)
Platelets: 280 (ref 150–399)
WBC: 10.9

## 2020-05-16 LAB — CBC AND DIFFERENTIAL
HCT: 27 — AB (ref 36–46)
Hemoglobin: 9 — AB (ref 12.0–16.0)
Platelets: 265 (ref 150–399)
WBC: 9.9

## 2020-05-16 LAB — CBC: RBC: 2.63 — AB (ref 3.87–5.11)

## 2020-05-17 LAB — CBC: RBC: 2.42 — AB (ref 3.87–5.11)

## 2020-05-17 LAB — CBC AND DIFFERENTIAL
HCT: 25 — AB (ref 36–46)
Hemoglobin: 8.5 — AB (ref 12.0–16.0)
Platelets: 237 (ref 150–399)
WBC: 12.6

## 2020-05-21 ENCOUNTER — Other Ambulatory Visit: Payer: Self-pay

## 2020-05-21 MED ORDER — HYDROCODONE-ACETAMINOPHEN 10-325 MG PO TABS: 1.0000 | ORAL_TABLET | Freq: Four times a day (QID) | ORAL | 0 refills | Status: DC | PRN

## 2020-05-22 ENCOUNTER — Non-Acute Institutional Stay (SKILLED_NURSING_FACILITY): Payer: Medicare Other | Admitting: Internal Medicine

## 2020-05-22 ENCOUNTER — Encounter: Payer: Self-pay | Admitting: Internal Medicine

## 2020-05-22 DIAGNOSIS — G300 Alzheimer's disease with early onset: Secondary | ICD-10-CM

## 2020-05-22 DIAGNOSIS — M4725 Other spondylosis with radiculopathy, thoracolumbar region: Secondary | ICD-10-CM | POA: Diagnosis not present

## 2020-05-22 DIAGNOSIS — G4733 Obstructive sleep apnea (adult) (pediatric): Secondary | ICD-10-CM | POA: Diagnosis not present

## 2020-05-22 DIAGNOSIS — Z9889 Other specified postprocedural states: Secondary | ICD-10-CM

## 2020-05-22 DIAGNOSIS — G43709 Chronic migraine without aura, not intractable, without status migrainosus: Secondary | ICD-10-CM

## 2020-05-22 DIAGNOSIS — F422 Mixed obsessional thoughts and acts: Secondary | ICD-10-CM

## 2020-05-22 DIAGNOSIS — F331 Major depressive disorder, recurrent, moderate: Secondary | ICD-10-CM

## 2020-05-22 DIAGNOSIS — F0280 Dementia in other diseases classified elsewhere without behavioral disturbance: Secondary | ICD-10-CM

## 2020-05-22 DIAGNOSIS — F411 Generalized anxiety disorder: Secondary | ICD-10-CM

## 2020-05-22 DIAGNOSIS — Z9989 Dependence on other enabling machines and devices: Secondary | ICD-10-CM

## 2020-05-22 DIAGNOSIS — N3281 Overactive bladder: Secondary | ICD-10-CM

## 2020-05-22 DIAGNOSIS — K582 Mixed irritable bowel syndrome: Secondary | ICD-10-CM

## 2020-05-22 DIAGNOSIS — E7800 Pure hypercholesterolemia, unspecified: Secondary | ICD-10-CM

## 2020-05-22 DIAGNOSIS — K219 Gastro-esophageal reflux disease without esophagitis: Secondary | ICD-10-CM

## 2020-05-22 NOTE — Progress Notes (Signed)
Provider:  Gwenith Spitz. Renato Gails, D.O., C.M.D.  PCP: Lasandra Beech, MD Patient Care Team: Lasandra Beech, MD as PCP - General (Unknown Physician Specialty)  Extended Emergency Contact Information Primary Emergency Contact: Spanos,Lloyd Address: [address]          [place], [place] Home Phone: [phone] Relation: Spouse Secondary Emergency Contact: Kendrick Fries [place] Home Phone: [phone] Relation: Son  Code Status: full code Goals of Care: Advanced Directive information Advanced Directives 05/24/2020  Does Patient Have a Medical Advance Directive? Yes  Type of Advance Directive -  Does patient want to make changes to medical advance directive? No - Patient declined   Chief Complaint  Patient presents with  . New Admit To SNF    New Admit     HPI: Patient is a 64 y.o. female with h/o early onset AD, OCD, major depression, GAD, hyperlipidemia, overactive bladder, GERD, migraines, HTN seen today for admission to [facility] s/p hospitalization from 8/16-8/20/21 with low back pain.  She underwent a planned surgery with Dr. Noel Gerold 8/16:  T10-S1 revision, PSF, TLIF, laminectomy, instrumentation, hardward removal and allograft.  Afterward, on 8/17, she sustained a fall and hit her head.  There was question if it was a seizure but imaging and EEG were negative.  She is here now for PT, OT.  Her pain regimen included fioricet q4h prn, oxycodone q6h prn, gabapentin  po tid, and a muscle relaxer (two were on her list, but we kept just one).  She's been sundowning according to nursing.    When seen, she was resting in bed.  She told me about some problems she had overnight with wearing her CPAP.  She has nasal pillows and one went missing and a nurse had to help her with it.  She was having considerable discomfort and spasms when I was in the room and she was waiting on her pain medication.    Otherwise, she  was feeling ok.  No c/o constipation.    Past Medical History:  Diagnosis Date  . Anemia   . Ankylosis of lumbar spine   . Anxiety   . Cephalalgia 02/13/2016  . Cervical spondylosis with myelopathy    Multi-level  . Chronic back pain 12/09/2019  . Class 2 severe obesity due to excess calories with serious comorbidity and body mass index (BMI) of 35.0 to 35.9 in adult (HCC) 09/29/2017  . Congenital spondylolisthesis   . Depression   . 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 Elevated PTHrP level 12/09/2019  . Gastroesophageal reflux disease   . Headache(784.0)   . Hypercholesterolemia   . Hypertensive disorder   . Hypotension   . 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 Lumbago with sciatica   . Lumbar post-laminectomy syndrome   . Myofascial pain   . OAB (overactive bladder) 12/09/2019  . OSA on CPAP 09/29/2017  . Other spondylosis with radiculopathy, thoracolumbar region 05/14/2020  . Pes planus of left foot 12/23/2017  . Posterior tibial tendinitis of left lower extremity 12/23/2017  . Sacroiliac dysfunction 01/19/2018  . Stage 3a chronic kidney disease 01/30/2020  . Stress fracture of left ankle 02/25/2018  . Synovitis of left foot 01/30/2020  . Tibialis posterior tendon tear, nontraumatic, left 05/28/2018   Past Surgical History:  Procedure Laterality Date  . REVISION T10-S1 POSTERIOR SPINAL FUSION     TLIF, LAMINECTOMY, INSTRUDTION AND ALLOGRAFT, HARDWARE REMOVAL  .  rotator cuff surgery    . SPINE SURGERY      Social History   Socioeconomic History  . Marital status: Married    Spouse name: Not on file  . Number of children: Not on file  . Years of education: Not on file  . Highest education level: Not on file  Occupational History  . Not on file  Tobacco Use  . Smoking status: Never Smoker  . Smokeless tobacco: Never Used  Substance and Sexual Activity  . Alcohol use: No  . Drug use: No  . Sexual  activity: Yes  Other Topics Concern  . Not on file  Social History Narrative  . Not on file   Social Determinants of Health   Financial Resource Strain:   . Difficulty of Paying Living Expenses: Not on file  Food Insecurity:   . Worried About Programme researcher, broadcasting/film/videounning Out of Food in the Last Year: Not on file  . Ran Out of Food in the Last Year: Not on file  Transportation Needs:   . Lack of Transportation (Medical): Not on file  . Lack of Transportation (Non-Medical): Not on file  Physical Activity:   . Days of Exercise per Week: Not on file  . Minutes of Exercise per Session: Not on file  Stress:   . Feeling of Stress : Not on file  Social Connections:   . Frequency of Communication with Friends and Family: Not on file  . Frequency of Social Gatherings with Friends and Family: Not on file  . Attends Religious Services: Not on file  . Active Member of Clubs or Organizations: Not on file  . Attends BankerClub or Organization Meetings: Not on file  . Marital Status: Not on file    reports that she has never smoked. She has never used smokeless tobacco. She reports that she does not drink alcohol and does not use drugs.  Functional Status Survey:    Family History  Problem Relation Age of Onset  . Bipolar disorder Father     Health Maintenance  Topic Date Due  . Hepatitis C Screening  Never done  . HIV Screening  Never done  . PAP SMEAR-Modifier  Never done  . MAMMOGRAM  Never done  . COLONOSCOPY  Never done  . INFLUENZA VACCINE  04/29/2020  . TETANUS/TDAP  03/18/2023  . COVID-19 Vaccine  Completed    No Known Allergies  Outpatient Encounter Medications as of 05/22/2020  Medication Sig  . aspirin EC 81 MG tablet Take 81 mg by mouth daily. Swallow whole.  . butalbital-acetaminophen-caffeine (ESGIC) 50-325-40 MG tablet Take 1 tablet by mouth every 4 (four) hours as needed for headache.  . Calcium-Vitamin D (CALTRATE 600 PLUS-VIT D PO) Take 1 tablet by mouth every evening.  .  Cholecalciferol (VITAMIN D-3) 125 MCG (5000 UT) TABS Take 1 tablet by mouth daily.  . cyanocobalamin 1000 MCG tablet Take 1,000 mcg by mouth every 14 (fourteen) days.  . ferrous sulfate 325 (65 FE) MG EC tablet Take 325 mg by mouth daily with breakfast.  . oxybutynin (DITROPAN-XL) 5 MG 24 hr tablet   . OXYBUTYNIN CHLORIDE ER PO Take 5 mg by mouth daily.  . propranolol ER (INDERAL LA) 80 MG 24 hr capsule   . [DISCONTINUED] busPIRone (BUSPAR) 10 MG tablet Take 10 mg by mouth 2 (two) times daily.  . [DISCONTINUED] colestipol (COLESTID) 1 g tablet Take 2 g by mouth 2 (two) times daily as needed.  . [DISCONTINUED] diphenoxylate-atropine (LOMOTIL) 2.5-0.025 MG tablet  Take 1 tablet by mouth in the morning, at noon, and at bedtime.  . [DISCONTINUED] donepezil (ARICEPT) 10 MG tablet Take 10 mg by mouth in the morning and at bedtime.  . [DISCONTINUED] famotidine (PEPCID) 20 MG tablet Take 20 mg by mouth every evening.  . [DISCONTINUED] fexofenadine (ALLEGRA) 180 MG tablet Take 180 mg by mouth daily.  . [DISCONTINUED] gabapentin (NEURONTIN) 300 MG capsule Take 600 mg by mouth 3 (three) times daily. 2 tablets to = 600 mg  . [DISCONTINUED] hydrOXYzine (ATARAX/VISTARIL) 25 MG tablet Take 25 mg by mouth 3 (three) times daily.  . [DISCONTINUED] lamoTRIgine (LAMICTAL) 150 MG tablet Take 150 mg by mouth in the morning, at noon, and at bedtime.   . [DISCONTINUED] memantine (NAMENDA) 10 MG tablet Take 10 mg by mouth 2 (two) times daily.  . [DISCONTINUED] Oxycodone HCl 10 MG TABS Take 10 mg by mouth every 6 (six) hours.  . [DISCONTINUED] pravastatin (PRAVACHOL) 40 MG tablet Take 40 mg by mouth every evening.   . [DISCONTINUED] rizatriptan (MAXALT) 10 MG tablet Take 10 mg by mouth daily as needed for migraine. May repeat in 2 hours if needed  . [DISCONTINUED] tiZANidine (ZANAFLEX) 4 MG tablet Take 4 mg by mouth daily as needed for muscle spasms.  . [DISCONTINUED] topiramate (TOPAMAX) 50 MG tablet Take 50 mg by mouth 2  (two) times daily.   . [DISCONTINUED] valACYclovir (VALTREX) 1000 MG tablet Take 1,000 mg by mouth daily.  . [DISCONTINUED] venlafaxine (EFFEXOR) 75 MG tablet Take 75 mg by mouth 2 (two) times daily.  . [DISCONTINUED] TRI-LEGEST FE 1-20/1-30/1-35 MG-MCG tablet    Facility-Administered Encounter Medications as of 05/22/2020  Medication  . triamcinolone acetonide (KENALOG) 10 MG/ML injection 10 mg    Review of Systems  Constitutional: Negative for chills, fever and malaise/fatigue.  HENT: Negative for congestion, hearing loss and sore throat.   Eyes: Negative for blurred vision.  Respiratory: Negative for cough and shortness of breath.   Cardiovascular: Negative for chest pain, palpitations and leg swelling.  Gastrointestinal: Positive for heartburn. Negative for abdominal pain, blood in stool, constipation and melena.  Genitourinary: Positive for frequency and urgency.       Chronic OAB  Musculoskeletal: Positive for back pain, falls and myalgias. Negative for neck pain.  Skin: Negative for itching and rash.  Neurological: Positive for headaches. Negative for dizziness, seizures and loss of consciousness.  Endo/Heme/Allergies: Does not bruise/bleed easily.  Psychiatric/Behavioral: Positive for depression and memory loss. The patient is nervous/anxious. The patient does not have insomnia.     Vitals:   05/22/20 1621  BP: 105/75  Pulse: 76  Temp: (!) 97.4 F (36.3 C)  Weight: 186 lb (84.4 kg)  Height: 5\' 1"  (1.549 m)   Body mass index is 35.14 kg/m. Physical Exam Vitals reviewed.  Constitutional:      General: She is not in acute distress.    Appearance: Normal appearance. She is not toxic-appearing.  HENT:     Head: Normocephalic and atraumatic.     Right Ear: External ear normal.     Left Ear: External ear normal.     Nose: Nose normal.     Mouth/Throat:     Pharynx: Oropharynx is clear.  Eyes:     Extraocular Movements: Extraocular movements intact.      Conjunctiva/sclera: Conjunctivae normal.     Pupils: Pupils are equal, round, and reactive to light.  Cardiovascular:     Rate and Rhythm: Normal rate and regular rhythm.  Pulses: Normal pulses.     Heart sounds: Normal heart sounds.  Pulmonary:     Effort: Pulmonary effort is normal.     Breath sounds: Normal breath sounds. No wheezing, rhonchi or rales.  Abdominal:     General: Bowel sounds are normal. There is no distension.     Palpations: Abdomen is soft.     Tenderness: There is no abdominal tenderness. There is no guarding or rebound.  Musculoskeletal:        General: Normal range of motion.     Cervical back: Neck supple.     Right lower leg: No edema.     Left lower leg: No edema.  Skin:    General: Skin is warm and dry.     Capillary Refill: Capillary refill takes less than 2 seconds.     Comments: Large incision from lower thoracic through sacrum appears clean, dry, intact (has allergy to tape so no dressing in place at present--reports that was a new discovery during this stay)  Neurological:     General: No focal deficit present.     Mental Status: She is alert.     Cranial Nerves: No cranial nerve deficit.     Comments: Oriented to person, place, not time  Psychiatric:        Mood and Affect: Mood normal.     Comments: Was pleasant, but distracted by her muscle spasms    Labs reviewed: Basic Metabolic Panel: Recent Labs    05/07/20 0000 05/15/20 0000  NA 139 133*  K 3.2* 3.7  CL 106 103  CO2 27* 22  BUN 15 13  CREATININE 1.0 1.0  CALCIUM 9.5 9.5   Liver Function Tests: Recent Labs    05/07/20 0000 05/15/20 0000  AST 13 45*  ALT 9 41*  ALKPHOS 83 77  ALBUMIN 4.0 4.0   No results for input(s): LIPASE, AMYLASE in the last 8760 hours. No results for input(s): AMMONIA in the last 8760 hours. CBC: Recent Labs    05/15/20 0000 05/16/20 0000 05/17/20 0000  WBC 10.9 9.9 12.6  HGB 10.1* 9.0* 8.5*  HCT 30* 27* 25*  PLT 280 265 237   Cardiac  Enzymes: No results for input(s): CKTOTAL, CKMB, CKMBINDEX, TROPONINI in the last 8760 hours. BNP: Invalid input(s): POCBNP No results found for: HGBA1C Lab Results  Component Value Date   TSH 1.47 11/13/2018   Lab Results  Component Value Date   VITAMINB12 2,000 05/28/2019   No results found for: FOLATE Lab Results  Component Value Date   IRON 84 11/16/2018   TIBC 268 11/16/2018     Assessment/Plan 1. Other spondylosis with radiculopathy, thoracolumbar region -has undergone revision of surgery with Dr. Noel Gerold  2. S/P laminectomy -f/u with Dr. Noel Gerold as recommended -wean oxycodone gradually -sounds like fioricet was also for her headaches? Not ideal with her dementia -cont gabapentin and muscle relaxer for now--also wean muscle relaxer when able  3. Early onset Alzheimer's dementia without behavioral disturbance (HCC) -at high risk for delirium -try to wean opioids and muscle relaxers that contribute and reorient, redirect as needed -cont aricept and namenda  4. OSA on CPAP -cont cpap with nasal pillows qhs per home settings  5. Moderate episode of recurrent major depressive disorder [facility]) -appears she has followed with Dr. Christell Constant from [facility] for psych -cont effexor, lamictal  6. GAD (generalized anxiety disorder) -continue buspar  7. Mixed obsessional thoughts and acts -notable, cont psychiatric meds as ordered -on atarax  8. Hypercholesterolemia -continues on pravachol  9. OAB (overactive bladder) -ideally would be on myrbetriq rather than oxybutynin b/c it's counteracting her aricept  10. Gastroesophageal reflux disease, unspecified whether esophagitis present -on pepcid  11.  IBS with diarrhea and constipation -on meds for both issues, cont home regimen, colestipol  12.  Migraine headaches -on regimen for this with fioricet (not ideal with dementia), prn maxalt, topamax  Family/ staff Communication: discussed with snf nurse  Labs/tests ordered:   Cbc, bmp in a week  Romey Mathieson L. Tramon Crescenzo, D.O. Geriatrics [facility] [address], [place] [address] Cell Phone (Mon-Fri 8am-5pm):  [phone] On Call:  [phone] & follow prompts after 5pm & weekends Office Phone:  [phone] Office Fax:  [phone]

## 2020-05-23 ENCOUNTER — Encounter: Payer: Self-pay | Admitting: Family

## 2020-05-23 ENCOUNTER — Non-Acute Institutional Stay (SKILLED_NURSING_FACILITY): Payer: Medicare Other | Admitting: Family

## 2020-05-23 DIAGNOSIS — M4725 Other spondylosis with radiculopathy, thoracolumbar region: Secondary | ICD-10-CM | POA: Diagnosis not present

## 2020-05-23 NOTE — Progress Notes (Signed)
Location:    [facility].   Nursing Home Room Number: 110-P Place of Service:  SNF (31) Provider:  Richarda Blade, NP    Patient Care Team: Lasandra Beech, MD as PCP - General (Unknown Physician Specialty)  Extended Emergency Contact Information Primary Emergency Contact: Carapia,Lloyd Address: [address]          [place], [place] Home Phone: [phone] Relation: Spouse Secondary Emergency Contact: Kendrick Fries [place] Home Phone: [phone] Relation: Son  Code Status:  DNR Goals of care: Advanced Directive information Advanced Directives 05/23/2020  Does Patient Have a Medical Advance Directive? Yes  Type of Advance Directive Out of facility DNR (pink MOST or yellow form)  Does patient want to make changes to medical advance directive? No - Patient declined     Chief Complaint  Patient presents with  . Acute Visit    Pain.    HPI:  Pt is a 64 y.o. female seen today for an acute visit for    Past Medical History:  Diagnosis Date  . Anemia   . Ankylosis of lumbar spine   . Anxiety   . Cephalalgia 02/13/2016  . Cervical spondylosis with myelopathy    Multi-level  . Chronic back pain 12/09/2019  . Class 2 severe obesity due to excess calories with serious comorbidity and body mass index (BMI) of 35.0 to 35.9 in adult (HCC) 09/29/2017  . Congenital spondylolisthesis   . Depression   . 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 Elevated PTHrP level 12/09/2019  . Gastroesophageal reflux disease   . Headache(784.0)   . Hypercholesterolemia   . Hypertensive disorder   . Hypotension   . 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 Lumbago with sciatica   . Lumbar post-laminectomy syndrome   . Myofascial pain   . OAB (overactive bladder) 12/09/2019  . OSA on CPAP 09/29/2017  . Other spondylosis with radiculopathy, thoracolumbar region  05/14/2020  . Pes planus of left foot 12/23/2017  . Posterior tibial tendinitis of left lower extremity 12/23/2017  . Sacroiliac dysfunction 01/19/2018  . Stage 3a chronic kidney disease 01/30/2020  . Stress fracture of left ankle 02/25/2018  . Synovitis of left foot 01/30/2020  . Tibialis posterior tendon tear, nontraumatic, left 05/28/2018   Past Surgical History:  Procedure Laterality Date  . REVISION T10-S1 POSTERIOR SPINAL FUSION     TLIF, LAMINECTOMY, INSTRUDTION AND ALLOGRAFT, HARDWARE REMOVAL  . rotator cuff surgery    . SPINE SURGERY      No Known Allergies  Allergies as of 05/23/2020   No Known Allergies     Medication List       Accurate as of May 23, 2020  3:20 PM. If you have any questions, ask your nurse or doctor.        aspirin EC 81 MG tablet Take 81 mg by mouth daily. Swallow whole.   busPIRone 10 MG tablet Commonly known as: BUSPAR Take 10 mg by mouth 2 (two) times daily.   CALTRATE 600 PLUS-VIT D PO Take 1 tablet by mouth every evening.   colestipol 1 g tablet Commonly known as: COLESTID Take 2 g by mouth 2 (two) times daily as needed.   cyanocobalamin 1000 MCG tablet Take 1,000 mcg by mouth every 14 (fourteen) days.   diphenoxylate-atropine 2.5-0.025 MG tablet Commonly known as: LOMOTIL Take 1 tablet by mouth in the morning, at noon, and at bedtime.  donepezil 10 MG tablet Commonly known as: ARICEPT Take 10 mg by mouth in the morning and at bedtime.   Esgic 50-325-40 MG tablet Generic drug: butalbital-acetaminophen-caffeine Take 1 tablet by mouth every 4 (four) hours as needed for headache.   famotidine 20 MG tablet Commonly known as: PEPCID Take 20 mg by mouth every evening.   ferrous sulfate 325 (65 FE) MG EC tablet Take 325 mg by mouth daily with breakfast.   fexofenadine 180 MG tablet Commonly known as: ALLEGRA Take 180 mg by mouth daily.   gabapentin 300 MG capsule Commonly known as: NEURONTIN Take 600 mg by mouth 3  (three) times daily. 2 tablets to = 600 mg   hydrOXYzine 25 MG tablet Commonly known as: ATARAX/VISTARIL Take 25 mg by mouth 3 (three) times daily.   lamoTRIgine 150 MG tablet Commonly known as: LAMICTAL Take 150 mg by mouth in the morning, at noon, and at bedtime.   memantine 10 MG tablet Commonly known as: NAMENDA Take 10 mg by mouth 2 (two) times daily.   MiraLax 17 g packet Generic drug: polyethylene glycol Take 17 g by mouth daily.   OXYBUTYNIN CHLORIDE ER PO Take 5 mg by mouth daily.   oxybutynin 5 MG 24 hr tablet Commonly known as: DITROPAN-XL   Oxycodone HCl 10 MG Tabs Take 10 mg by mouth every 6 (six) hours.   pravastatin 40 MG tablet Commonly known as: PRAVACHOL Take 40 mg by mouth every evening.   propranolol ER 80 MG 24 hr capsule Commonly known as: INDERAL LA   rizatriptan 10 MG tablet Commonly known as: MAXALT Take 10 mg by mouth daily as needed for migraine. May repeat in 2 hours if needed   tiZANidine 4 MG tablet Commonly known as: ZANAFLEX Take 4 mg by mouth daily as needed for muscle spasms.   topiramate 50 MG tablet Commonly known as: TOPAMAX Take 50 mg by mouth 2 (two) times daily.   valACYclovir 1000 MG tablet Commonly known as: VALTREX Take 1,000 mg by mouth daily.   venlafaxine 75 MG tablet Commonly known as: EFFEXOR Take 75 mg by mouth 2 (two) times daily.   Vitamin D-3 125 MCG (5000 UT) Tabs Take 1 tablet by mouth daily.       Review of Systems  Immunization History  Administered Date(s) Administered  . Influenza, Seasonal, Injecte, Preservative Fre 06/20/2015, 07/10/2016, 07/02/2017, 07/24/2018  . Influenza,inj,Quad PF,6-35 Mos 05/27/2019  . Influenza-Unspecified 06/20/2015, 06/20/2015, 07/10/2016, 07/02/2017, 07/24/2018  . PFIZER SARS-COV-2 Vaccination 11/05/2019, 11/26/2019  . Pneumococcal Conjugate-13 07/24/2018  . Tdap 03/17/2013   Pertinent  Health Maintenance Due  Topic Date Due  . PAP SMEAR-Modifier  Never done    . MAMMOGRAM  Never done  . COLONOSCOPY  Never done  . INFLUENZA VACCINE  04/29/2020   No flowsheet data found. Functional Status Survey:    Vitals:   05/23/20 1503  BP: 125/79  Pulse: 77  Resp: 18  Temp: (!) 97.3 F (36.3 C)  Weight: 186 lb (84.4 kg)  Height: 5\' 1"  (1.549 m)   Body mass index is 35.14 kg/m. Physical Exam  Labs reviewed: Recent Labs    05/07/20 0000 05/15/20 0000  NA 139 133*  K 3.2* 3.7  CL 106 103  CO2 27* 22  BUN 15 13  CREATININE 1.0 1.0  CALCIUM 9.5 9.5   Recent Labs    05/07/20 0000 05/15/20 0000  AST 13 45*  ALT 9 41*  ALKPHOS 83 77  ALBUMIN 4.0 4.0  Recent Labs    05/15/20 0000 05/16/20 0000 05/17/20 0000  WBC 10.9 9.9 12.6  HGB 10.1* 9.0* 8.5*  HCT 30* 27* 25*  PLT 280 265 237   Lab Results  Component Value Date   TSH 1.47 11/13/2018   No results found for: HGBA1C Lab Results  Component Value Date   CHOL 219 (A) 05/28/2019   HDL 64 05/28/2019   LDLCALC 126 05/28/2019   TRIG 143 05/28/2019    Significant Diagnostic Results in last 30 days:  No results found.  Assessment/Plan There are no diagnoses linked to this encounter.   Family/ staff Communication:   Labs/tests ordered:

## 2020-05-23 NOTE — Progress Notes (Signed)
Location:  [facility] Room Number: 110-P Place of Service:  SNF (31) Provider: Giliana Vantil FNP-C  Lasandra Beech, MD  Patient Care Team: Lasandra Beech, MD as PCP - General (Unknown Physician Specialty)  Extended Emergency Contact Information Primary Emergency Contact: Crowell,Lloyd Address: [address]          [place], [place] Home Phone: [phone] Relation: Spouse Secondary Emergency Contact: Kendrick Fries [place] Home Phone: [phone] Relation: Son  Code Status:  Full Code  Goals of care: Advanced Directive information Advanced Directives 05/22/2020  Does Patient Have a Medical Advance Directive? Yes     Chief Complaint  Patient presents with  . Acute Visit    Pain.    HPI:  Pt is a 64 y.o. female seen today for an acute visit for evaluation of back pain.she is seen in her room lying quietly in the bed with blinds and lights turned off.Facility Nurse reports patient's Husband was in to visit requested patient's oxycodone 10 mg tablet every 6 hrs to be scheduled for pain states patient forgot to request for pain medication last night.also request muscle relaxant tizanidine 4 mg tablet to be schedule at bedtime.patient states pain currently under control has had a pain medication prior to visit.she complains of muscle spasm during the visit while trying to turn in bed.she is status post revision T10-S1 posterior spinal fusion TLIF,Laminectomy,Hardware removal done 05/14/2020 by Dr.Cohen Edmonia Lynch at [facility] due to 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 Discussed with patient increased risk for sedation scheduling pain medication.she verbalized understanding will continue to notify Nurse when in pain and facility Nurse will evaluate pain at least every shift and offer pain medication.she requested for her  CPAP machine on her bedside table at the end of the visit.she has a significant medical history of OSA on CPAP.    Past Medical History:  Diagnosis Date  . Anemia   . Ankylosis of lumbar spine   . Anxiety   . Cephalalgia 02/13/2016  . Cervical spondylosis with myelopathy    Multi-level  . Chronic back pain 12/09/2019  . Class 2 severe obesity due to excess calories with serious comorbidity and body mass index (BMI) of 35.0 to 35.9 in adult (HCC) 09/29/2017  . Congenital spondylolisthesis   . Depression   . 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 Elevated PTHrP level 12/09/2019  . Gastroesophageal reflux disease   . Headache(784.0)   . Hypercholesterolemia   . Hypertensive disorder   . Hypotension   . 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 Lumbago with sciatica   . Lumbar post-laminectomy syndrome   . Myofascial pain   . OAB (overactive bladder) 12/09/2019  . OSA on CPAP 09/29/2017  . Other spondylosis with radiculopathy, thoracolumbar region 05/14/2020  . Pes planus of left foot 12/23/2017  . Posterior tibial tendinitis of left lower extremity 12/23/2017  . Sacroiliac dysfunction 01/19/2018  . Stage 3a chronic kidney disease 01/30/2020  . Stress fracture of left ankle 02/25/2018  . Synovitis of left foot 01/30/2020  . Tibialis posterior tendon tear, nontraumatic, left 05/28/2018   Past Surgical History:  Procedure Laterality Date  . REVISION T10-S1 POSTERIOR SPINAL FUSION     TLIF, LAMINECTOMY, INSTRUDTION AND ALLOGRAFT, HARDWARE REMOVAL  . rotator cuff surgery    . SPINE SURGERY      No  Known Allergies  Outpatient Encounter Medications as of 05/23/2020  Medication Sig  . aspirin EC 81 MG tablet Take 81 mg by mouth daily. Swallow whole.  . busPIRone (BUSPAR) 10 MG tablet Take 10 mg by mouth 2 (two) times daily.  . butalbital-acetaminophen-caffeine (ESGIC) 50-325-40 MG tablet Take 1 tablet by mouth every 4 (four)  hours as needed for headache.  . Calcium-Vitamin D (CALTRATE 600 PLUS-VIT D PO) Take 1 tablet by mouth every evening.  . Cholecalciferol (VITAMIN D-3) 125 MCG (5000 UT) TABS Take 1 tablet by mouth daily.  . colestipol (COLESTID) 1 g tablet Take 2 g by mouth 2 (two) times daily as needed.  . cyanocobalamin 1000 MCG tablet Take 1,000 mcg by mouth every 14 (fourteen) days.  . diphenoxylate-atropine (LOMOTIL) 2.5-0.025 MG tablet Take 1 tablet by mouth in the morning, at noon, and at bedtime.  . donepezil (ARICEPT) 10 MG tablet Take 10 mg by mouth in the morning and at bedtime.  . famotidine (PEPCID) 20 MG tablet Take 20 mg by mouth every evening.  . ferrous sulfate 325 (65 FE) MG EC tablet Take 325 mg by mouth daily with breakfast.  . fexofenadine (ALLEGRA) 180 MG tablet Take 180 mg by mouth daily.  Marland Kitchen gabapentin (NEURONTIN) 300 MG capsule Take 600 mg by mouth 3 (three) times daily. 2 tablets to = 600 mg  . hydrOXYzine (ATARAX/VISTARIL) 25 MG tablet Take 25 mg by mouth 3 (three) times daily.  Marland Kitchen lamoTRIgine (LAMICTAL) 150 MG tablet Take 150 mg by mouth in the morning, at noon, and at bedtime.   . memantine (NAMENDA) 10 MG tablet Take 10 mg by mouth 2 (two) times daily.  Marland Kitchen oxybutynin (DITROPAN-XL) 5 MG 24 hr tablet   . OXYBUTYNIN CHLORIDE ER PO Take 5 mg by mouth daily.  . Oxycodone HCl 10 MG TABS Take 10 mg by mouth every 6 (six) hours.  . pravastatin (PRAVACHOL) 40 MG tablet Take 40 mg by mouth every evening.   . propranolol ER (INDERAL LA) 80 MG 24 hr capsule   . rizatriptan (MAXALT) 10 MG tablet Take 10 mg by mouth daily as needed for migraine. May repeat in 2 hours if needed  . tiZANidine (ZANAFLEX) 4 MG tablet Take 4 mg by mouth daily as needed for muscle spasms.  Marland Kitchen topiramate (TOPAMAX) 50 MG tablet Take 50 mg by mouth 2 (two) times daily.   . valACYclovir (VALTREX) 1000 MG tablet Take 1,000 mg by mouth daily.  Marland Kitchen venlafaxine (EFFEXOR) 75 MG tablet Take 75 mg by mouth 2 (two) times daily.    Facility-Administered Encounter Medications as of 05/23/2020  Medication  . triamcinolone acetonide (KENALOG) 10 MG/ML injection 10 mg    Review of Systems  Constitutional: Negative for appetite change, chills, fatigue and fever.  Respiratory: Negative for cough, chest tightness, shortness of breath and wheezing.   Cardiovascular: Negative for chest pain, palpitations and leg swelling.  Gastrointestinal: Negative for abdominal distention, abdominal pain, constipation, diarrhea, nausea and vomiting.  Musculoskeletal: Positive for arthralgias, back pain and gait problem. Negative for joint swelling and myalgias.       Complains of back spasm   Skin: Negative for color change, pallor and rash.  Neurological: Negative for dizziness, speech difficulty, light-headedness, numbness and headaches.  Psychiatric/Behavioral: Negative for agitation, behavioral problems, confusion and sleep disturbance. The patient is not nervous/anxious.     Immunization History  Administered Date(s) Administered  . Influenza, Seasonal, Injecte, Preservative Fre 06/20/2015, 07/10/2016, 07/02/2017, 07/24/2018  . Influenza,inj,Quad  PF,6-35 Mos 05/27/2019  . Influenza-Unspecified 06/20/2015, 06/20/2015, 07/10/2016, 07/02/2017, 07/24/2018  . PFIZER SARS-COV-2 Vaccination 11/05/2019, 11/26/2019  . Pneumococcal Conjugate-13 07/24/2018  . Tdap 03/17/2013   Pertinent  Health Maintenance Due  Topic Date Due  . PAP SMEAR-Modifier  Never done  . MAMMOGRAM  Never done  . COLONOSCOPY  Never done  . INFLUENZA VACCINE  04/29/2020   No flowsheet data found.     Vitals:   05/23/20 1503  BP: 125/79  Pulse: 77  Resp: 18  Temp: (!) 97.3 F (36.3 C)  Weight: 186 lb (84.4 kg)  Height: 5\' 1"  (1.549 m)   Body mass index is 35.14 kg/m. Physical Exam Vitals and nursing note reviewed.  Constitutional:      General: She is not in acute distress.    Appearance: She is obese. She is not ill-appearing.  Eyes:      General: No scleral icterus.       Right eye: No discharge.        Left eye: No discharge.     Conjunctiva/sclera: Conjunctivae normal.     Pupils: Pupils are equal, round, and reactive to light.  Cardiovascular:     Rate and Rhythm: Normal rate and regular rhythm.     Pulses: Normal pulses.     Heart sounds: Normal heart sounds. No murmur heard.  No friction rub. No gallop.   Pulmonary:     Effort: No respiratory distress.     Breath sounds: Normal breath sounds. No wheezing, rhonchi or rales.  Chest:     Chest wall: No tenderness.  Abdominal:     General: Bowel sounds are normal. There is no distension.     Palpations: Abdomen is soft. There is no mass.     Tenderness: There is no abdominal tenderness. There is no guarding or rebound.  Musculoskeletal:        General: No swelling or tenderness.     Right lower leg: No edema.     Left lower leg: No edema.     Comments: Moves x 4 extremities   Skin:    General: Skin is warm.     Coloration: Skin is not pale.     Findings: No bruising, erythema or rash.     Comments: Thoracic - lumbar surgical incision open to air dry,clean and intact.No signs of infection noted.   Neurological:     Mental Status: She is alert and oriented to person, place, and time.     Cranial Nerves: No cranial nerve deficit.     Motor: No weakness.     Coordination: Coordination normal.     Comments: Assessed in bed   Psychiatric:        Mood and Affect: Mood normal.        Speech: Speech normal.        Behavior: Behavior is cooperative.        Thought Content: Thought content normal.        Judgment: Judgment normal.     Comments: Falls asleep at times mid her conversation and repeats words.     Labs reviewed: Recent Labs    05/07/20 0000 05/15/20 0000  NA 139 133*  K 3.2* 3.7  CL 106 103  CO2 27* 22  BUN 15 13  CREATININE 1.0 1.0  CALCIUM 9.5 9.5   Recent Labs    05/07/20 0000 05/15/20 0000  AST 13 45*  ALT 9 41*  ALKPHOS 83 77   ALBUMIN 4.0 4.0  Recent Labs    05/15/20 0000 05/16/20 0000 05/17/20 0000  WBC 10.9 9.9 12.6  HGB 10.1* 9.0* 8.5*  HCT 30* 27* 25*  PLT 280 265 237   Lab Results  Component Value Date   TSH 1.47 11/13/2018   No results found for: HGBA1C Lab Results  Component Value Date   CHOL 219 (A) 05/28/2019   HDL 64 05/28/2019   LDLCALC 126 05/28/2019   TRIG 143 05/28/2019    Significant Diagnostic Results in last 30 days:  No results found.  Assessment/Plan   Other spondylosis with radiculopathy, thoracolumbar region status post revision T10-S1 posterior spinal fusion TLIF,Laminectomy,Hardware removal on 05/14/2020 [id number]. - discussed with facility to continue to assess pain every shift and PRN  - continue on oxycodone 10 mg tablet one by mouth every 6 hrs as needed for pain.will avoid scheduling due to high risk for sedation.   - will changed Tizanidine from PRN at bedtime to 4 mg tablet one by mouth daily at bedtime  - continue with Therapy.   Family/ staff Communication: Reviewed plan of care with patient and facility Nurse.   Labs/tests ordered: None   Next Appointment: As needed if symptoms worsen or fail to improve.    Caesar Bookmaninah C Shamanda Len, NP

## 2020-05-24 ENCOUNTER — Non-Acute Institutional Stay (SKILLED_NURSING_FACILITY): Payer: Medicare Other

## 2020-05-24 DIAGNOSIS — G43709 Chronic migraine without aura, not intractable, without status migrainosus: Secondary | ICD-10-CM

## 2020-05-24 DIAGNOSIS — R2681 Unsteadiness on feet: Secondary | ICD-10-CM

## 2020-05-24 DIAGNOSIS — F0280 Dementia in other diseases classified elsewhere without behavioral disturbance: Secondary | ICD-10-CM

## 2020-05-24 DIAGNOSIS — N3281 Overactive bladder: Secondary | ICD-10-CM

## 2020-05-24 DIAGNOSIS — G629 Polyneuropathy, unspecified: Secondary | ICD-10-CM

## 2020-05-24 DIAGNOSIS — E782 Mixed hyperlipidemia: Secondary | ICD-10-CM

## 2020-05-24 DIAGNOSIS — R569 Unspecified convulsions: Secondary | ICD-10-CM

## 2020-05-24 DIAGNOSIS — M4725 Other spondylosis with radiculopathy, thoracolumbar region: Secondary | ICD-10-CM

## 2020-05-24 DIAGNOSIS — G300 Alzheimer's disease with early onset: Secondary | ICD-10-CM

## 2020-05-24 DIAGNOSIS — F331 Major depressive disorder, recurrent, moderate: Secondary | ICD-10-CM

## 2020-05-24 DIAGNOSIS — J302 Other seasonal allergic rhinitis: Secondary | ICD-10-CM

## 2020-05-24 DIAGNOSIS — K219 Gastro-esophageal reflux disease without esophagitis: Secondary | ICD-10-CM

## 2020-05-24 DIAGNOSIS — F411 Generalized anxiety disorder: Secondary | ICD-10-CM

## 2020-05-24 MED ORDER — VENLAFAXINE HCL 75 MG PO TABS: 75.0000 mg | ORAL_TABLET | Freq: Two times a day (BID) | ORAL | 0 refills | Status: AC

## 2020-05-24 MED ORDER — BUSPIRONE HCL 10 MG PO TABS: 10.0000 mg | ORAL_TABLET | Freq: Two times a day (BID) | ORAL | 0 refills | Status: AC

## 2020-05-24 MED ORDER — COLESTIPOL HCL 1 G PO TABS: 2.0000 g | ORAL_TABLET | Freq: Two times a day (BID) | ORAL | 0 refills | Status: AC | PRN

## 2020-05-24 MED ORDER — MEMANTINE HCL 10 MG PO TABS: 10.0000 mg | ORAL_TABLET | Freq: Two times a day (BID) | ORAL | 0 refills | Status: AC

## 2020-05-24 MED ORDER — FEXOFENADINE HCL 180 MG PO TABS: 180.0000 mg | ORAL_TABLET | Freq: Every day | ORAL | 0 refills | Status: AC

## 2020-05-24 MED ORDER — FAMOTIDINE 20 MG PO TABS: 20.0000 mg | ORAL_TABLET | Freq: Every evening | ORAL | 0 refills | Status: AC

## 2020-05-24 MED ORDER — PRAVASTATIN SODIUM 40 MG PO TABS: 40.0000 mg | ORAL_TABLET | Freq: Every evening | ORAL | 0 refills | Status: AC

## 2020-05-24 MED ORDER — TOPIRAMATE 50 MG PO TABS: 50.0000 mg | ORAL_TABLET | Freq: Two times a day (BID) | ORAL | 0 refills | Status: AC

## 2020-05-24 MED ORDER — DIPHENOXYLATE-ATROPINE 2.5-0.025 MG PO TABS
1.0000 | ORAL_TABLET | Freq: Three times a day (TID) | ORAL | 0 refills | Status: AC
Start: 2020-05-24 — End: ?

## 2020-05-24 MED ORDER — DONEPEZIL HCL 10 MG PO TABS: 10.0000 mg | ORAL_TABLET | Freq: Two times a day (BID) | ORAL | 0 refills | Status: AC

## 2020-05-24 MED ORDER — VALACYCLOVIR HCL 1 G PO TABS: 1000.0000 mg | ORAL_TABLET | Freq: Every day | ORAL | 0 refills | Status: AC

## 2020-05-24 MED ORDER — TIZANIDINE HCL 4 MG PO TABS: 4.0000 mg | ORAL_TABLET | Freq: Every day | ORAL | 0 refills | Status: AC | PRN

## 2020-05-24 MED ORDER — GABAPENTIN 300 MG PO CAPS: 600.0000 mg | ORAL_CAPSULE | Freq: Three times a day (TID) | ORAL | 0 refills | Status: AC

## 2020-05-24 MED ORDER — OXYCODONE HCL 10 MG PO TABS: 10.0000 mg | ORAL_TABLET | Freq: Four times a day (QID) | ORAL | 0 refills | Status: AC

## 2020-05-24 MED ORDER — LAMOTRIGINE 150 MG PO TABS: 150.0000 mg | ORAL_TABLET | Freq: Three times a day (TID) | ORAL | 0 refills | Status: AC

## 2020-05-24 MED ORDER — RIZATRIPTAN BENZOATE 10 MG PO TABS: 10.0000 mg | ORAL_TABLET | Freq: Every day | ORAL | 0 refills | Status: AC | PRN

## 2020-05-24 MED ORDER — HYDROXYZINE HCL 25 MG PO TABS: 25.0000 mg | ORAL_TABLET | Freq: Three times a day (TID) | ORAL | 0 refills | Status: AC

## 2020-05-24 NOTE — Progress Notes (Signed)
Location:  [facility] Room Number: 110-P Place of Service:  SNF [phone])  Provider: Richarda Blade FNP-C   PCP: Lasandra Beech, MD Patient Care Team: Lasandra Beech, MD as PCP - General (Unknown Physician Specialty)  Extended Emergency Contact Information Primary Emergency Contact: Spindle,Lloyd Address: [address]          [place], [place] Home Phone: [phone] Relation: Spouse Secondary Emergency Contact: Kendrick Fries [place] Home Phone: [phone] Relation: Son  Code Status: DNR Goals of care:  Advanced Directive information Advanced Directives 05/24/2020  Does Patient Have a Medical Advance Directive? Yes  Type of Advance Directive -  Does patient want to make changes to medical advance directive? No - Patient declined     No Known Allergies  Chief Complaint  Patient presents with   Discharge Note    Discharge from SNF to home with husband 05/25/2020.    HPI:  64 y.o. female seen today at [facility] for discharge home with Husband on 05/25/2020.she was here for short term rehabilitation for post hospital admission at [facility] 05/14/2020 for spondylosis with radiculopathy thoracolumbar region status post revision of T10 -S1 posterior spinal fusion,TLIF,Laminectomy,instrumentation,Osteotomy,allograft  and hardware removal done 05/14/2020 by Dr.Cohen Edmonia Lynch. she has a medical history of Mixed hyperlipidemia,GERD,Migraine,OSA on CPAP,OAB,anxiety with Depression,Dementia without behavioral disturbance,chronic back pain with Neuropathy,Obesity,CKD stage 3 a among other conditions.she continues to require oxycodone 10 mg tablet every 6 hrs as needed along with Tizanidine.Husband had requested on 05/23/2020 for pain medication to be scheduled every 6 hrs stated patient forgets to ask for pain medication this was discouraged due to high risk for  sedation.Patient was advised Nurse will assess her pain every shift and as needed then offer pain medication if due.she is seen sitting up at bedside today with her back brace in place.     She has worked well with PT/OT now stable for discharge home with Husband. She will be discharged home  with Home health PT/OT to continue with ROM, Exercise, Gait stability and muscle strengthening.She will also require Home health Nurse for mid -back surgical incision care.She will does not require any new DME has own Rolling walker to allow her to maintain current level of independence with ADL's. Home health services will be arranged by facility social worker prior to discharge. Prescription medication will be written x 1 month then patient to follow up with PCP in 1-2 weeks.She denies any acute issues this visit. Facility staff report no new concerns.     Past Medical History:  Diagnosis Date   Anemia    Ankylosis of lumbar spine    Anxiety    Cephalalgia 02/13/2016   Cervical spondylosis with myelopathy    Multi-level   Chronic back pain 12/09/2019   Class 2 severe obesity due to excess calories with serious comorbidity and body mass index (BMI) of 35.0 to 35.9 in adult [facility]) 09/29/2017   Congenital spondylolisthesis    Depression    Disorder of parathyroid gland, unspecified (HCC) 12/09/2019   Elevated PTHrP level 12/09/2019   Gastroesophageal reflux disease    Headache(784.0)    Hypercholesterolemia    Hypertensive disorder    Hypotension    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    Lumbago with sciatica    Lumbar post-laminectomy syndrome    Myofascial pain    OAB (overactive bladder) 12/09/2019   OSA on CPAP  09/29/2017   Other spondylosis with radiculopathy, thoracolumbar region 05/14/2020   Pes planus of left foot 12/23/2017   Posterior tibial tendinitis of left lower extremity 12/23/2017   Sacroiliac dysfunction 01/19/2018   Stage  3a chronic kidney disease 01/30/2020   Stress fracture of left ankle 02/25/2018   Synovitis of left foot 01/30/2020   Tibialis posterior tendon tear, nontraumatic, left 05/28/2018    Past Surgical History:  Procedure Laterality Date   REVISION T10-S1 POSTERIOR SPINAL FUSION     TLIF, LAMINECTOMY, INSTRUDTION AND ALLOGRAFT, HARDWARE REMOVAL   rotator cuff surgery     SPINE SURGERY        reports that she has never smoked. She has never used smokeless tobacco. She reports that she does not drink alcohol and does not use drugs. Social History   Socioeconomic History   Marital status: Married    Spouse name: Not on file   Number of children: Not on file   Years of education: Not on file   Highest education level: Not on file  Occupational History   Not on file  Tobacco Use   Smoking status: Never Smoker   Smokeless tobacco: Never Used  Substance and Sexual Activity   Alcohol use: No   Drug use: No   Sexual activity: Yes  Other Topics Concern   Not on file  Social History Narrative   Not on file   Social Determinants of Health   Financial Resource Strain:    Difficulty of Paying Living Expenses: Not on file  Food Insecurity:    Worried About Running Out of Food in the Last Year: Not on file   Ran Out of Food in the Last Year: Not on file  Transportation Needs:    Lack of Transportation (Medical): Not on file   Lack of Transportation (Non-Medical): Not on file  Physical Activity:    Days of Exercise per Week: Not on file   Minutes of Exercise per Session: Not on file  Stress:    Feeling of Stress : Not on file  Social Connections:    Frequency of Communication with Friends and Family: Not on file   Frequency of Social Gatherings with Friends and Family: Not on file   Attends Religious Services: Not on file   Active Member of Clubs or Organizations: Not on file   Attends Banker Meetings: Not on file   Marital Status: Not  on file  Intimate Partner Violence:    Fear of Current or Ex-Partner: Not on file   Emotionally Abused: Not on file   Physically Abused: Not on file   Sexually Abused: Not on file   Functional Status Survey:    No Known Allergies  Pertinent  Health Maintenance Due  Topic Date Due   PAP SMEAR-Modifier  Never done   MAMMOGRAM  Never done   COLONOSCOPY  Never done   INFLUENZA VACCINE  04/29/2020    Medications: Outpatient Encounter Medications as of 05/24/2020  Medication Sig   aspirin EC 81 MG tablet Take 81 mg by mouth daily. Swallow whole.   busPIRone (BUSPAR) 10 MG tablet Take 10 mg by mouth 2 (two) times daily.   butalbital-acetaminophen-caffeine (ESGIC) 50-325-40 MG tablet Take 1 tablet by mouth every 4 (four) hours as needed for headache.   Calcium-Vitamin D (CALTRATE 600 PLUS-VIT D PO) Take 1 tablet by mouth every evening.   Cholecalciferol (VITAMIN D-3) 125 MCG (5000 UT) TABS Take 1 tablet by mouth daily.   colestipol (  COLESTID) 1 g tablet Take 2 g by mouth 2 (two) times daily as needed.   cyanocobalamin 1000 MCG tablet Take 1,000 mcg by mouth every 14 (fourteen) days.   diphenoxylate-atropine (LOMOTIL) 2.5-0.025 MG tablet Take 1 tablet by mouth in the morning, at noon, and at bedtime.   donepezil (ARICEPT) 10 MG tablet Take 10 mg by mouth in the morning and at bedtime.   famotidine (PEPCID) 20 MG tablet Take 20 mg by mouth every evening.   ferrous sulfate 325 (65 FE) MG EC tablet Take 325 mg by mouth daily with breakfast.   fexofenadine (ALLEGRA) 180 MG tablet Take 180 mg by mouth daily.   gabapentin (NEURONTIN) 300 MG capsule Take 600 mg by mouth 3 (three) times daily. 2 tablets to = 600 mg   hydrOXYzine (ATARAX/VISTARIL) 25 MG tablet Take 25 mg by mouth 3 (three) times daily.   lamoTRIgine (LAMICTAL) 150 MG tablet Take 150 mg by mouth in the morning, at noon, and at bedtime.    memantine (NAMENDA) 10 MG tablet Take 10 mg by mouth 2 (two) times  daily.   oxybutynin (DITROPAN-XL) 5 MG 24 hr tablet    OXYBUTYNIN CHLORIDE ER PO Take 5 mg by mouth daily.   Oxycodone HCl 10 MG TABS Take 10 mg by mouth every 6 (six) hours.   polyethylene glycol (MIRALAX) 17 g packet Take 17 g by mouth daily.   pravastatin (PRAVACHOL) 40 MG tablet Take 40 mg by mouth every evening.    propranolol ER (INDERAL LA) 80 MG 24 hr capsule    rizatriptan (MAXALT) 10 MG tablet Take 10 mg by mouth daily as needed for migraine. May repeat in 2 hours if needed   tiZANidine (ZANAFLEX) 4 MG tablet Take 4 mg by mouth daily as needed for muscle spasms.   topiramate (TOPAMAX) 50 MG tablet Take 50 mg by mouth 2 (two) times daily.    valACYclovir (VALTREX) 1000 MG tablet Take 1,000 mg by mouth daily.   venlafaxine (EFFEXOR) 75 MG tablet Take 75 mg by mouth 2 (two) times daily.   Facility-Administered Encounter Medications as of 05/24/2020  Medication   triamcinolone acetonide (KENALOG) 10 MG/ML injection 10 mg     Review of Systems  Constitutional: Negative for appetite change, chills, fatigue and fever.  HENT: Negative for congestion, postnasal drip, rhinorrhea, sinus pressure, sinus pain, sneezing and sore throat.   Eyes: Negative for discharge, redness and itching.  Respiratory: Negative for cough, chest tightness, shortness of breath and wheezing.   Cardiovascular: Negative for chest pain, palpitations and leg swelling.  Gastrointestinal: Negative for abdominal distention, abdominal pain, constipation, diarrhea, nausea and vomiting.  Endocrine: Negative for cold intolerance, heat intolerance, polydipsia, polyphagia and polyuria.  Genitourinary: Negative for difficulty urinating, dysuria, flank pain, frequency and urgency.  Musculoskeletal: Positive for arthralgias, back pain and gait problem. Negative for joint swelling and myalgias.  Skin: Negative for color change, pallor and rash.       Mid-back surgical incision   Neurological: Negative for  dizziness, seizures, speech difficulty, weakness, light-headedness, numbness and headaches.  Hematological: Does not bruise/bleed easily.  Psychiatric/Behavioral: Negative for agitation, behavioral problems, confusion and sleep disturbance. The patient is not nervous/anxious.     Vitals:   05/24/20 0953  BP: 115/73  Pulse: 74  Resp: 18  Temp: (!) 97.1 F (36.2 C)  Weight: 186 lb (84.4 kg)  Height:  (1.549 m)   Body mass index is 35.14 kg/m. Physical Exam Vitals and nursing note  reviewed.  Constitutional:      General: She is not in acute distress.    Appearance: She is not ill-appearing.  HENT:     Head: Normocephalic.     Nose: Nose normal. No congestion or rhinorrhea.     Mouth/Throat:     Mouth: Mucous membranes are moist.     Pharynx: Oropharynx is clear. No oropharyngeal exudate or posterior oropharyngeal erythema.  Eyes:     General: No scleral icterus.       Right eye: No discharge.        Left eye: No discharge.     Conjunctiva/sclera: Conjunctivae normal.     Pupils: Pupils are equal, round, and reactive to light.  Cardiovascular:     Rate and Rhythm: Normal rate and regular rhythm.     Pulses: Normal pulses.     Heart sounds: Normal heart sounds. No murmur heard.  No friction rub. No gallop.   Pulmonary:     Effort: Pulmonary effort is normal. No respiratory distress.     Breath sounds: Normal breath sounds. No wheezing, rhonchi or rales.  Chest:     Chest wall: No tenderness.  Abdominal:     General: Bowel sounds are normal. There is no distension.     Palpations: Abdomen is soft. There is no mass.     Tenderness: There is no abdominal tenderness. There is no guarding or rebound.  Musculoskeletal:        General: No swelling or tenderness.     Cervical back: Normal range of motion. No rigidity or tenderness.     Right lower leg: No edema.     Left lower leg: No edema.     Comments: Unsteady gait moves  X 4 extremities without any difficulty    Lymphadenopathy:     Cervical: No cervical adenopathy.  Skin:    General: Skin is warm.     Coloration: Skin is not pale.     Findings: No bruising, erythema or rash.     Comments: Mid-lower back surgical incision open to air well approximated.,clean ,dry and intact.surrounding skin without any erythema.   Neurological:     Mental Status: She is alert and oriented to person, place, and time.     Cranial Nerves: No cranial nerve deficit.     Sensory: No sensory deficit.     Motor: No weakness.     Coordination: Coordination normal.     Gait: Gait abnormal.  Psychiatric:        Mood and Affect: Mood normal.        Behavior: Behavior normal.        Thought Content: Thought content normal.     Comments: Difficult finding words at times    Labs reviewed: Basic Metabolic Panel: Recent Labs    05/07/20 0000 05/15/20 0000  NA 139 133*  K 3.2* 3.7  CL 106 103  CO2 27* 22  BUN 15 13  CREATININE 1.0 1.0  CALCIUM 9.5 9.5   Liver Function Tests: Recent Labs    05/07/20 0000 05/15/20 0000  AST 13 45*  ALT 9 41*  ALKPHOS 83 77  ALBUMIN 4.0 4.0   CBC: Recent Labs    05/15/20 0000 05/16/20 0000 05/17/20 0000  WBC 10.9 9.9 12.6  HGB 10.1* 9.0* 8.5*  HCT 30* 27* 25*  PLT 280 265 237   Procedures and Imaging Studies During Stay: No results found.  Assessment/Plan:    1. Unsteady gait Has worked well  with PT/ OT.She will discharge home PT/OT to continue with ROM, Exercise, Gait stability and muscle strengthening. Has own Rolling walker no new DME required. Fall and safety precautions.  2. Other spondylosis with radiculopathy, thoracolumbar region Status post Hospital admission as above with status post revision of T10 -S1 posterior spinal fusion,TLIF,Laminectomy,instrumentation,Osteotomy,allograft  and hardware removal done 05/14/2020 by Dr.Cohen Edmonia Lynch. - Mid-lower back surgical incision open to air well approximated.,clean ,dry and intact.surrounding skin without  any erythema.  - Home health Nurse for surgical incision care.  - continue on Oxycodone 10 mg tablet and Tizanidine  Oxycodone filled for 2 weeks # 54 advised to follow up with PCP in 1-2 weeks.  - tiZANidine (ZANAFLEX) 4 MG tablet; Take 1 tablet (4 mg total) by mouth daily as needed for muscle spasms.  Dispense: 30 tablet; Refill: 0 - Oxycodone HCl 10 MG TABS; Take 1 tablet (10 mg total) by mouth every 6 (six) hours for 14 days.  Dispense: 56 tablet; Refill: 0 CBC, BMP in 1-2 weeks PCP   3. Mixed hyperlipidemia No latest LDL for review.will defer to PCP - continue on Pravastatin and dietary modification.  - pravastatin (PRAVACHOL) 40 MG tablet; Take 1 tablet (40 mg total) by mouth every evening.  Dispense: 30 tablet; Refill: 0  4. Gastroesophageal reflux disease without esophagitis Symptoms controlled.continue on Famotidine.  - famotidine (PEPCID) 20 MG tablet; Take 1 tablet (20 mg total) by mouth every evening.  Dispense: 30 tablet; Refill: 0  5. Neuropathy Continue on Gabapentin - gabapentin (NEURONTIN) 300 MG capsule; Take 2 capsules (600 mg total) by mouth 3 (three) times daily. 2 tablets to = 600 mg  Dispense: 180 capsule; Refill: 0  6. Chronic migraine without aura without status migrainosus, not intractable Chronic sensitive to light.continue on Maxalt and Topamax. - rizatriptan (MAXALT) 10 MG tablet; Take 1 tablet (10 mg total) by mouth daily as needed for migraine. May repeat in 2 hours if needed  Dispense: 10 tablet; Refill: 0 - topiramate (TOPAMAX) 50 MG tablet; Take 1 tablet (50 mg total) by mouth 2 (two) times daily.  Dispense: 60 tablet; Refill: 0 - continue to follow up with PCP and Neurology  7. Early onset Alzheimer's dementia without behavioral disturbance (HCC) No new behavioral issues reported. - continue on donepezil and Memantine  - continue on supportive care - donepezil (ARICEPT) 10 MG tablet; Take 1 tablet (10 mg total) by mouth in the morning and at bedtime.   Dispense: 60 tablet; Refill: 0 - memantine (NAMENDA) 10 MG tablet; Take 1 tablet (10 mg total) by mouth 2 (two) times daily.  Dispense: 60 tablet; Refill: 0  8. OAB (overactive bladder) Continue on Ditropan XL   9. Seizure (HCC) No seizure activity reported. Continue on Lamictal  - lamoTRIgine (LAMICTAL) 150 MG tablet; Take 1 tablet (150 mg total) by mouth in the morning, at noon, and at bedtime.  Dispense: 90 tablet; Refill: 0  10. GAD (generalized anxiety disorder) Stable.continue on Buspar and Hydroxyzine  - busPIRone (BUSPAR) 10 MG tablet; Take 1 tablet (10 mg total) by mouth 2 (two) times daily.  Dispense: 60 tablet; Refill: 0 - hydrOXYzine (ATARAX/VISTARIL) 25 MG tablet; Take 1 tablet (25 mg total) by mouth 3 (three) times daily.  Dispense: 90 tablet; Refill: 0  11. Moderate episode of recurrent major depressive disorder (HCC) Mood stable.continue on Venlafaxine,Buspar and Hydroxyzine  - venlafaxine (EFFEXOR) 75 MG tablet; Take 1 tablet (75 mg total) by mouth 2 (two) times daily.  Dispense: 60  tablet; Refill: 0  12. Seasonal allergies Symptoms controlled.continue on Fexofenadine.  - fexofenadine (ALLEGRA) 180 MG tablet; Take 1 tablet (180 mg total) by mouth daily.  Dispense: 30 tablet; Refill: 0  Patient is being discharged with the following home health services:   -PT/OT for ROM, exercise, gait stability and muscle strengthening  -  HH RN for wound care management    Patient is being discharged with the following durable medical equipment:   - None has own Rolling walker.  Patient has been advised to f/u with their PCP in 1-2 weeks to for a transitions of care visit.Social services at their facility was responsible for arranging this appointment.  Pt was provided with adequate prescriptions of noncontrolled medications to reach the scheduled appointment.For controlled substances, a limited supply was provided as appropriate for the individual patient. If the pt normally  receives these medications from a pain clinic or has a contract with another physician, these medications should be received from that clinic or physician only).    Future labs/tests needed:  CBC, BMP in 1-2 weeks PCP

## 2020-05-25 DIAGNOSIS — Z9889 Other specified postprocedural states: Secondary | ICD-10-CM | POA: Insufficient documentation

## 2020-05-25 NOTE — Addendum Note (Signed)
Addended byRicharda Blade C on: 05/25/2020 08:41 AM   Modules accepted: Level of Service

## 2020-07-22 ENCOUNTER — Other Ambulatory Visit: Payer: Self-pay | Admitting: Family

## 2020-07-22 DIAGNOSIS — F0280 Dementia in other diseases classified elsewhere without behavioral disturbance: Secondary | ICD-10-CM

## 2020-07-22 DIAGNOSIS — G300 Alzheimer's disease with early onset: Secondary | ICD-10-CM

## 2021-04-08 ENCOUNTER — Other Ambulatory Visit: Payer: Self-pay | Admitting: Orthopaedic Surgery

## 2021-04-08 DIAGNOSIS — M5080 Other cervical disc disorders, unspecified cervical region: Secondary | ICD-10-CM

## 2021-04-08 DIAGNOSIS — M5134 Other intervertebral disc degeneration, thoracic region: Secondary | ICD-10-CM

## 2021-04-16 ENCOUNTER — Ambulatory Visit
Admission: RE | Admit: 2021-04-16 | Discharge: 2021-04-16 | Disposition: A | Discharge status: Home / Self Care | Payer: Medicare Other | Source: Ambulatory Visit | Attending: Orthopaedic Surgery | Admitting: Orthopaedic Surgery

## 2021-04-16 ENCOUNTER — Other Ambulatory Visit: Payer: Self-pay

## 2021-04-16 DIAGNOSIS — M5080 Other cervical disc disorders, unspecified cervical region: Secondary | ICD-10-CM

## 2021-04-16 DIAGNOSIS — M5134 Other intervertebral disc degeneration, thoracic region: Secondary | ICD-10-CM

## 2021-04-16 IMAGING — MR MR CERVICAL SPINE W/O CM
5 series · 42 of 48 positions shown · non-contrast
Comparison: [DATE]

CLINICAL DATA: Calcification of intervertebral cartilage or disc of
cervical region. Pain and imbalance with falls. Prior cervical
fusion.

EXAM:
MRI CERVICAL SPINE WITHOUT CONTRAST
TECHNIQUE: Multiplanar, multisequence MR imaging of the cervical spine was
performed. No intravenous contrast was administered.

[Series 9: T2 · sagittal · 3.0mm · 0.82mm/px · 7 of 15 slices shown (1 of 2)]
[im 1/15]
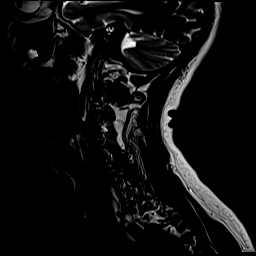
[im 3/15]
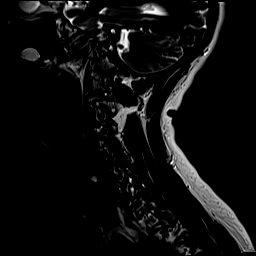
[im 5/15]
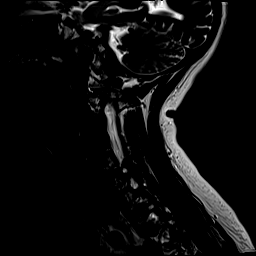
[im 8/15]
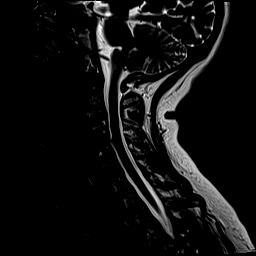
[im 10/15]
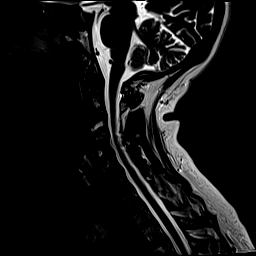
[im 12/15]
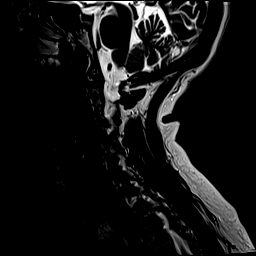
[im 15/15]
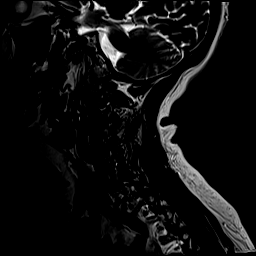

[Series 10: T1 · sagittal · 3.0mm · 0.82mm/px · 7 of 15 slices shown]
[im 1/15]
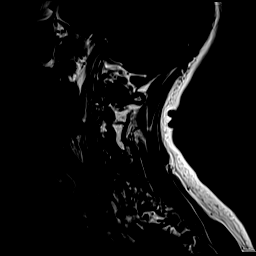
[im 3/15]
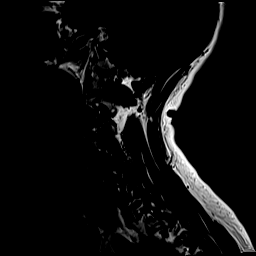
[im 5/15]
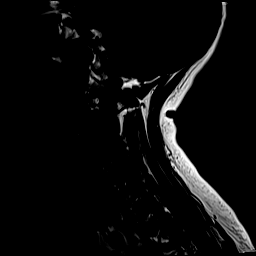
[im 8/15]
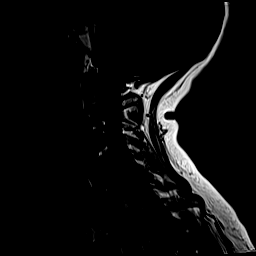
[im 10/15]
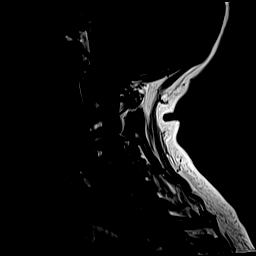
[im 12/15]
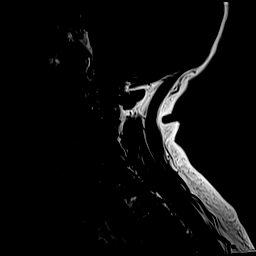
[im 15/15]
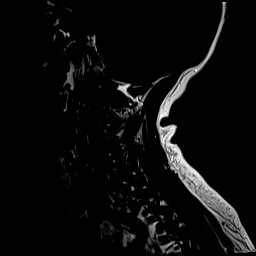

[Series 11: STIR · sagittal · 3.0mm · 0.41mm/px · 6 of 15 slices shown]
[im 1/15]
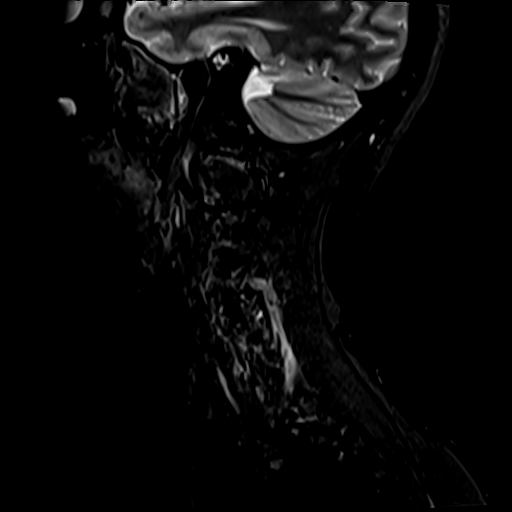
[im 3/15]
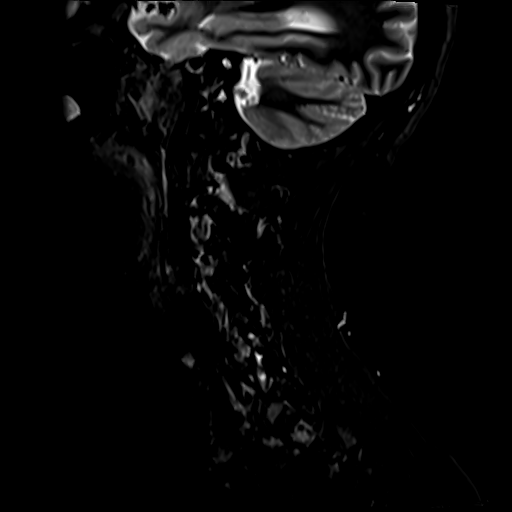
[im 6/15]
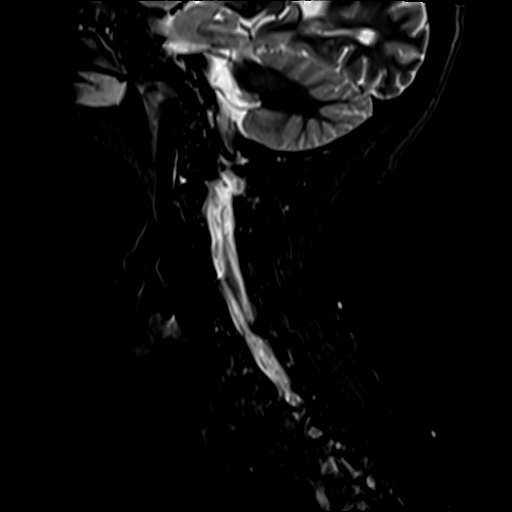
[im 9/15]
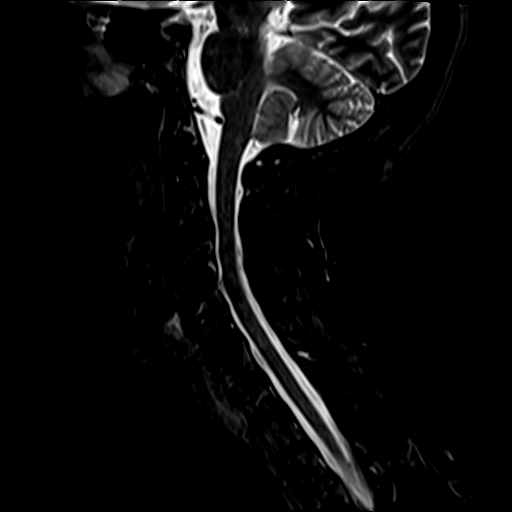
[im 12/15]
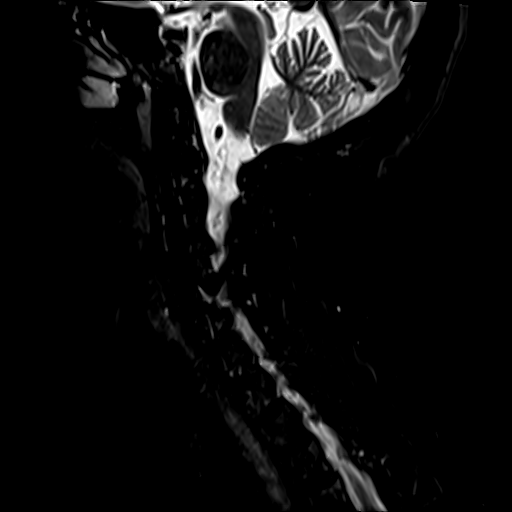
[im 15/15]
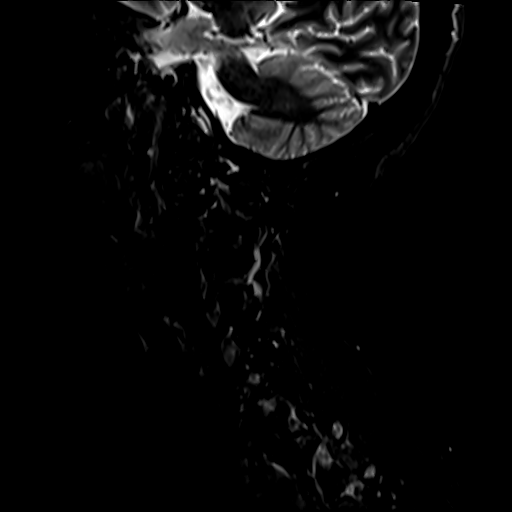

[Series 12: T2 · axial · 3.0mm · 0.62mm/px · z∈[-215,-109]mm · 14 of 34 slices shown (2 of 2)]
[im 1/34]
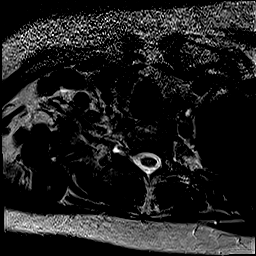
[im 3/34]
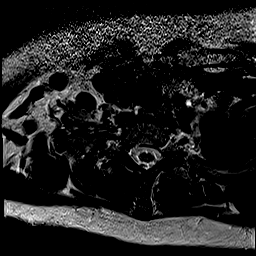
[im 6/34]
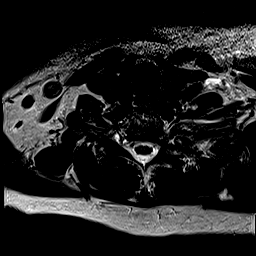
[im 8/34]
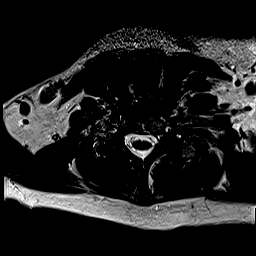
[im 11/34]
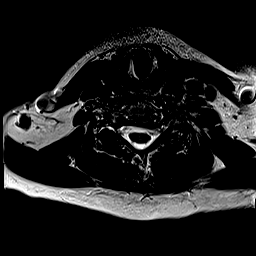
[im 13/34]
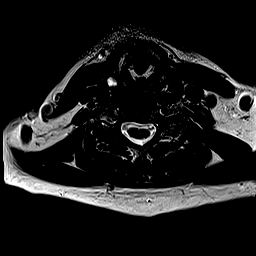
[im 16/34]
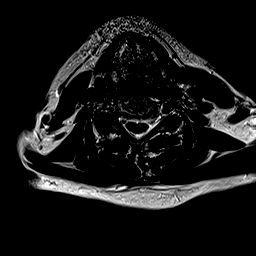
[im 18/34]
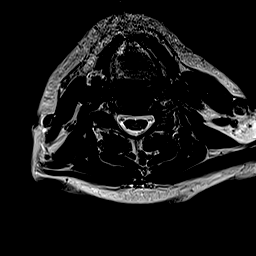
[im 21/34]
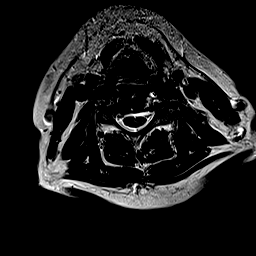
[im 23/34]
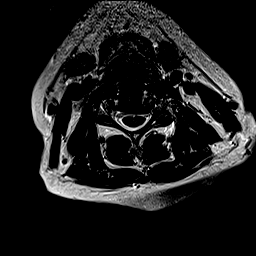
[im 26/34]
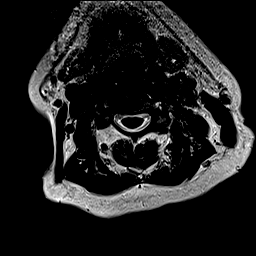
[im 28/34]
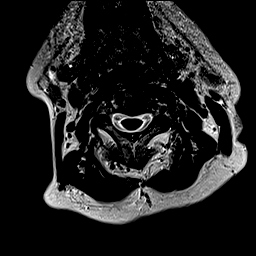
[im 31/34]
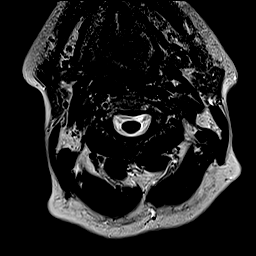
[im 34/34]
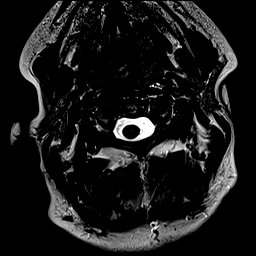

[Series 13: GRE · axial · 3.0mm · 0.42mm/px · z∈[-215,-109]mm · 8 of 34 slices shown]
[im 1/34]
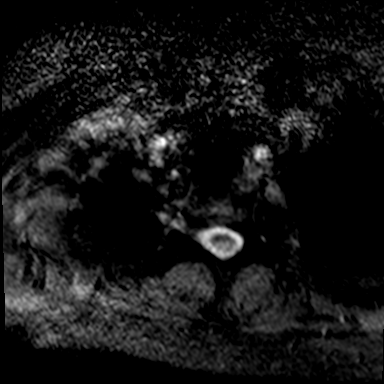
[im 6/34]
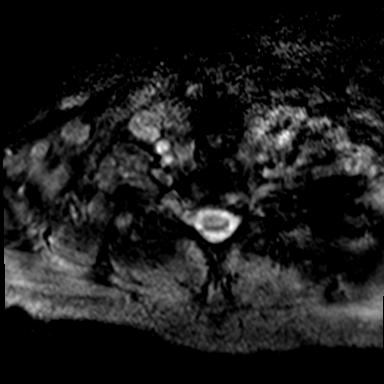
[im 11/34]
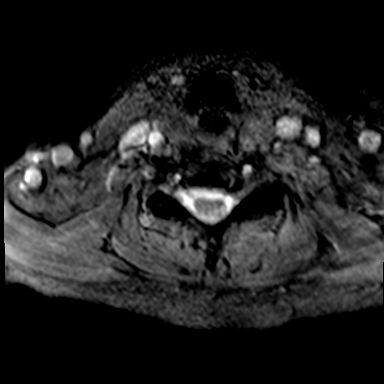
[im 16/34]
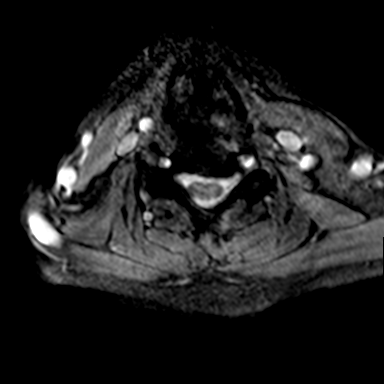
[im 18/34]
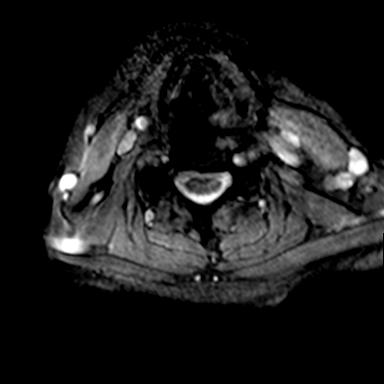
[im 23/34]
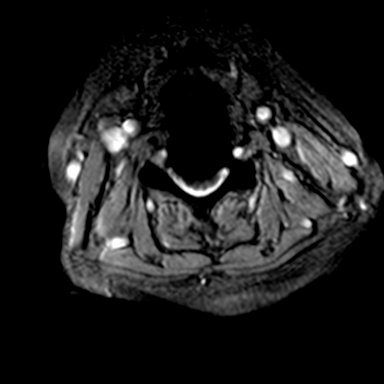
[im 28/34]
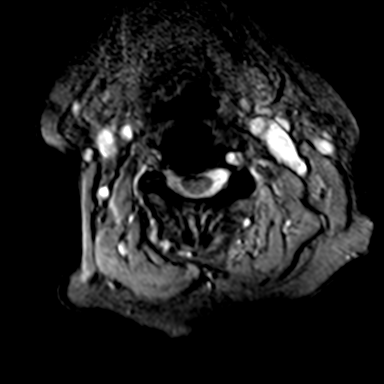
[im 34/34]
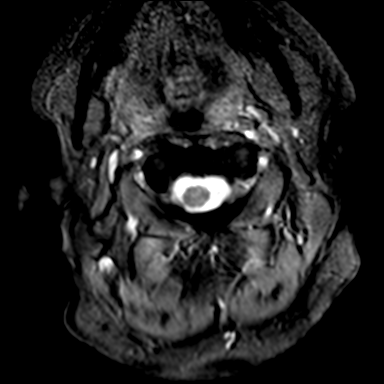

[42 of 48 positions shown; findings below may reference images not displayed]

FINDINGS: Alignment: Mild chronic straightening of the normal cervical
lordosis. No significant listhesis.

Vertebrae: No fracture or suspicious marrow lesion. Interval C3-4
ACDF. Upper thoracic facet arthrosis as noted on today's separate
thoracic spine MRI including bilateral facet joint effusions with
facet edema extending into the transverse processes at T2-3.

Cord: Normal signal and morphology.

Posterior Fossa, vertebral arteries, paraspinal tissues: 6 mm T2
hyperintense right thyroid nodule for which no imaging follow-up is
recommended. Preserved vertebral artery flow voids. Unremarkable
included posterior fossa. Small mucous retention cyst in the right
maxillary sinus.

Disc levels:

C2-3: Mild left facet arthrosis without stenosis.

C3-4: Interval ACDF.  No significant residual stenosis.

C4-5: Progressive mild-to-moderate facet arthrosis without disc
herniation or stenosis.

C5-6: Shallow left paracentral disc protrusion, mild uncovertebral
spurring, and moderate right facet arthrosis. Facet arthrosis has
mildly progressed, however there is no significant stenosis.

C6-7: Moderate to severe left facet arthrosis without disc
herniation or stenosis, unchanged.

C7-T1: Mild-to-moderate facet arthrosis without disc herniation or
stenosis, unchanged.
IMPRESSION: 1. Interval C3-4 ACDF without residual stenosis.
2. Progressive multilevel facet arthrosis without stenosis.

## 2021-04-16 IMAGING — MR MR THORACIC SPINE W/O CM
5 of 7 series · 33 of 48 positions shown · non-contrast
Comparison: [DATE]

CLINICAL DATA: Degeneration of thoracic intervertebral disc. Pain
and imbalance with falls. Prior thoracolumbar fusion.

EXAM:
MRI THORACIC SPINE WITHOUT CONTRAST
TECHNIQUE: Multiplanar, multisequence MR imaging of the thoracic spine was
performed. No intravenous contrast was administered.

[Series 17: T1 · sagittal · 3.0mm · 1.25mm/px · 6 of 23 slices shown]
[im 1/23]
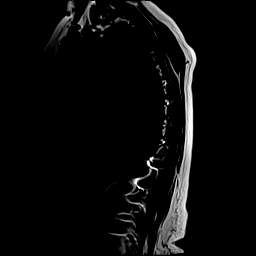
[im 5/23]
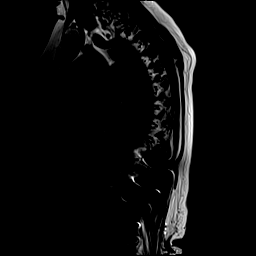
[im 9/23]
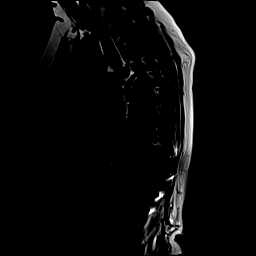
[im 14/23]
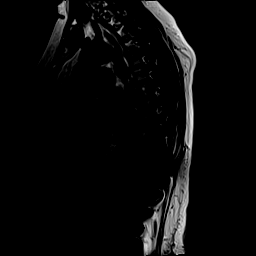
[im 18/23]
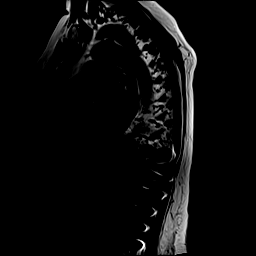
[im 23/23]
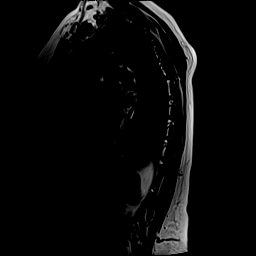

[Series 18: STIR · sagittal · 3.0mm · 1.00mm/px · 7 of 23 slices shown (1 of 2)]
[im 1/23]
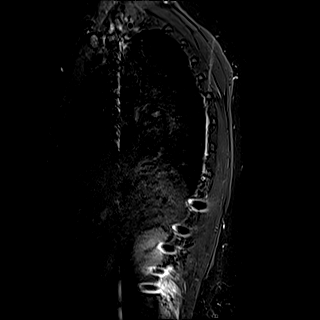
[im 4/23]
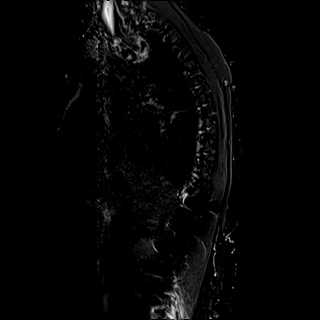
[im 8/23]
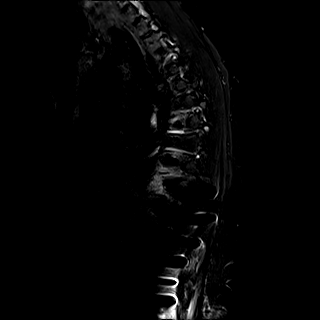
[im 12/23]
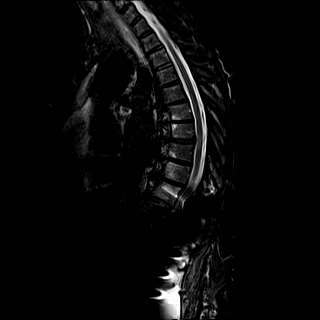
[im 15/23]
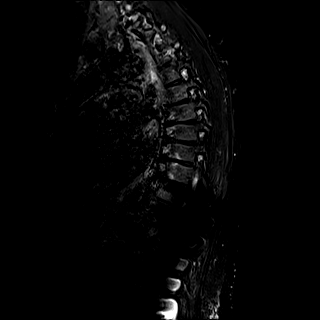
[im 19/23]
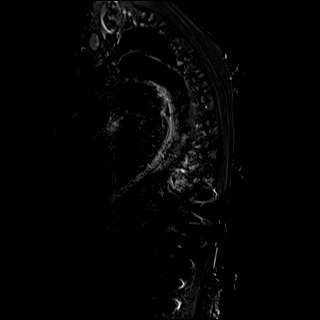
[im 23/23]
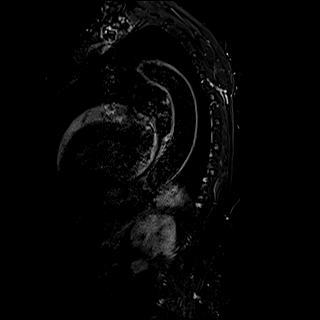

[Series 19: T2 · sagittal · 3.0mm · 0.83mm/px · 7 of 23 slices shown (1 of 2)]
[im 1/23]
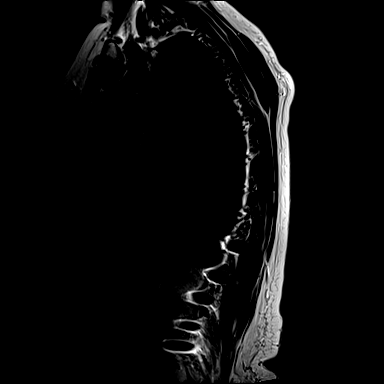
[im 4/23]
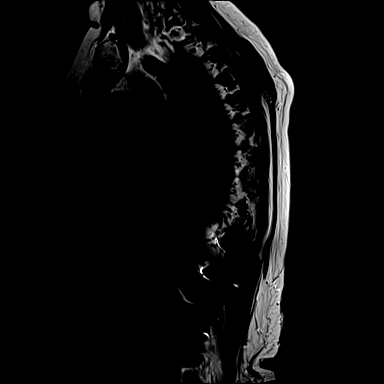
[im 8/23]
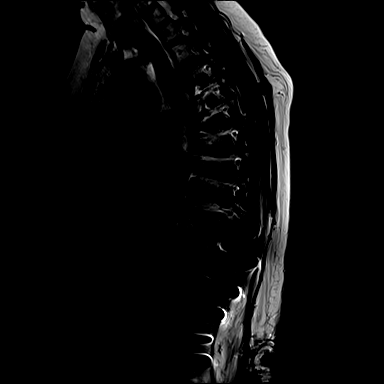
[im 12/23]
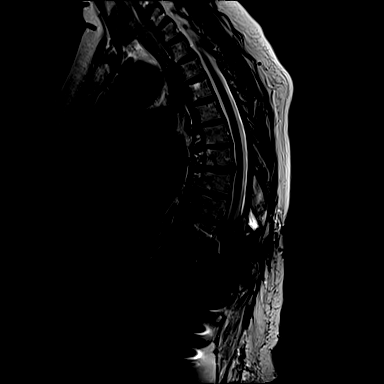
[im 15/23]
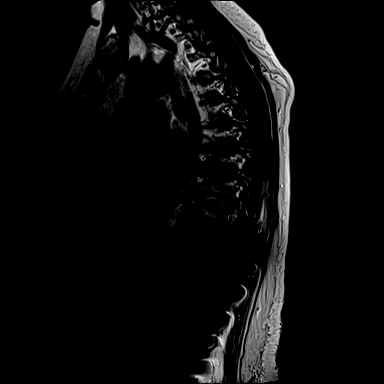
[im 19/23]
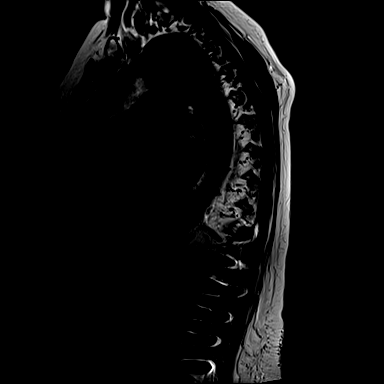
[im 23/23]
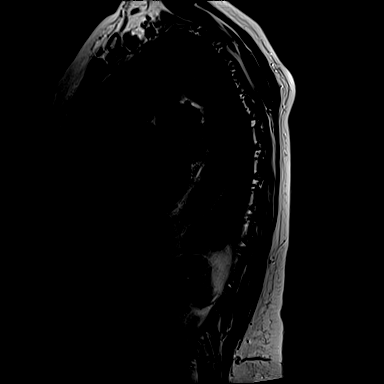

[Series 20: STIR · sagittal · 3.0mm · 1.00mm/px · 5 of 23 slices shown (2 of 2)]
[im 1/23]
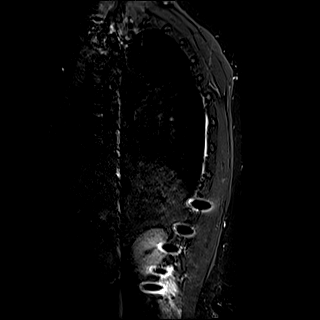
[im 4/23]
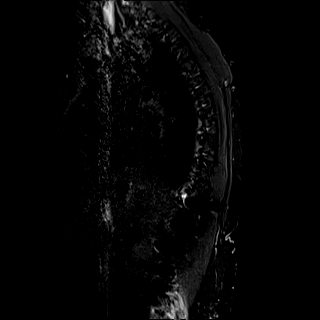
[im 8/23]
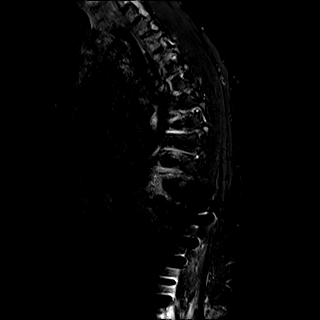
[im 12/23]
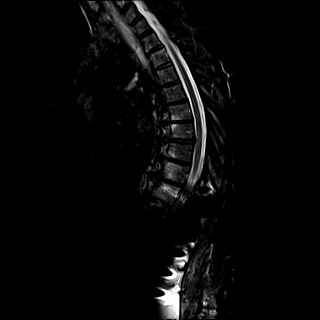
[im 15/23]
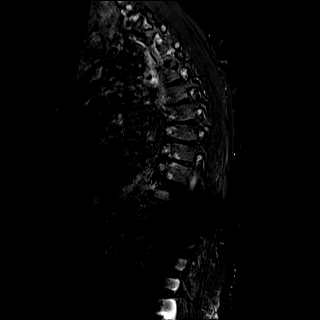

[Series 21: T2 · axial · 4.0mm · 0.35mm/px · z∈[-379,-234]mm · 8 of 28 slices shown (2 of 2)]
[im 1/28]
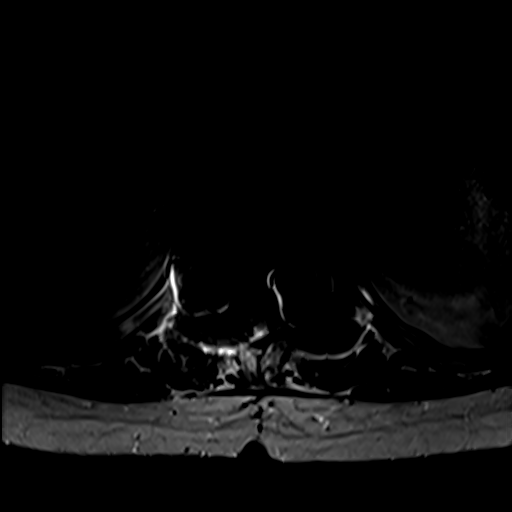
[im 4/28]
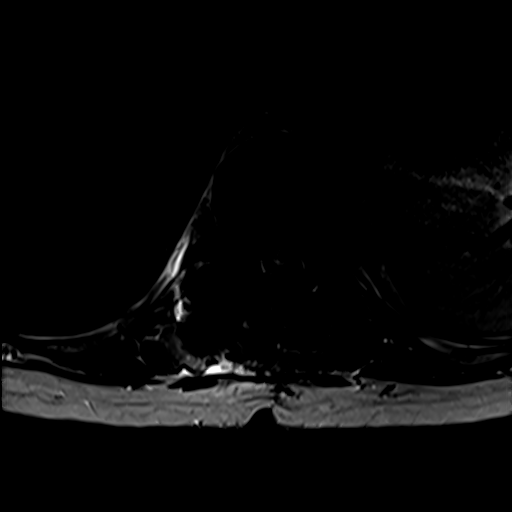
[im 8/28]
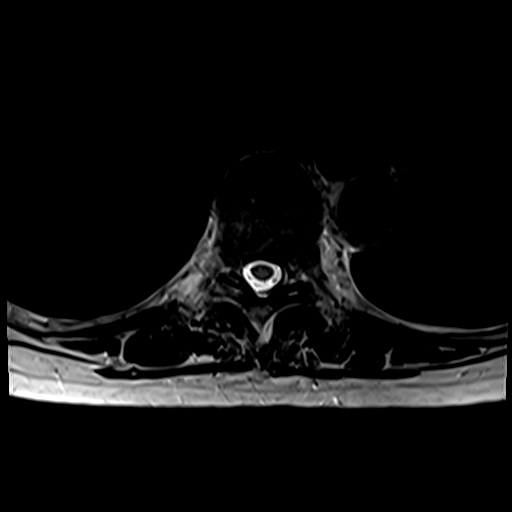
[im 12/28]
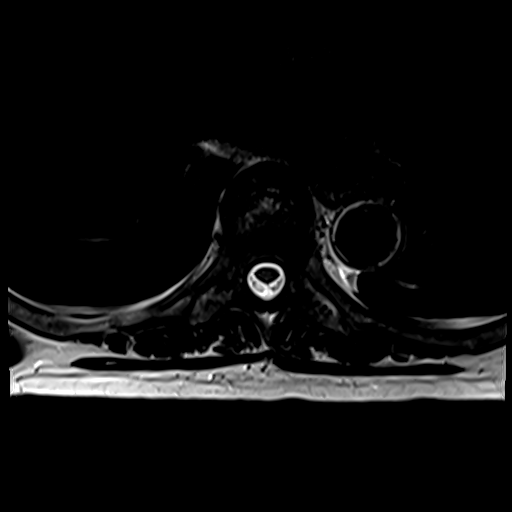
[im 16/28]
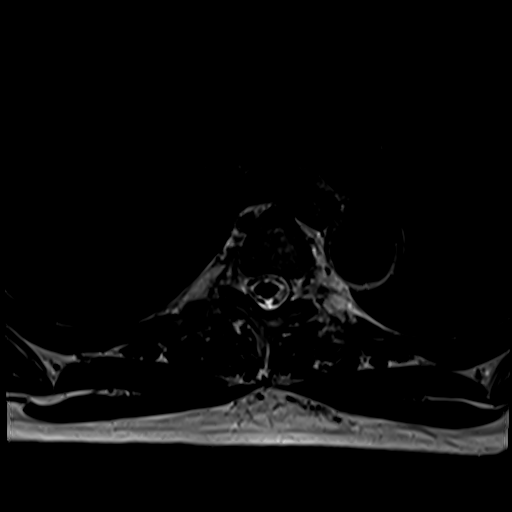
[im 20/28]
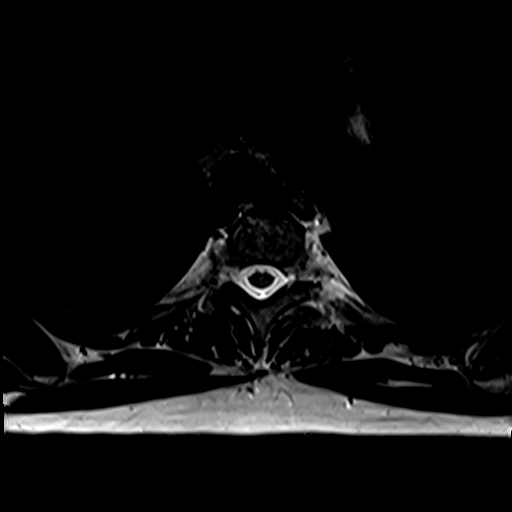
[im 24/28]
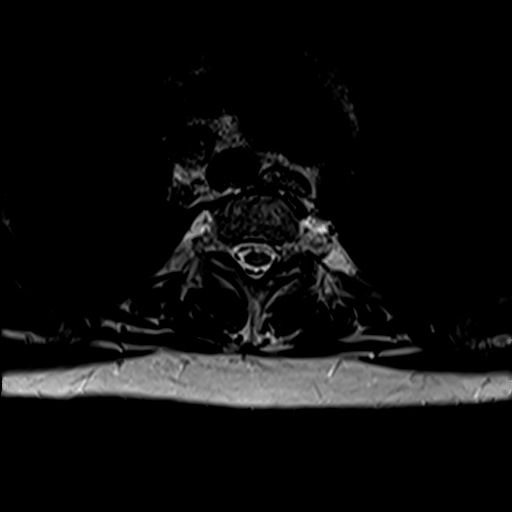
[im 28/28]
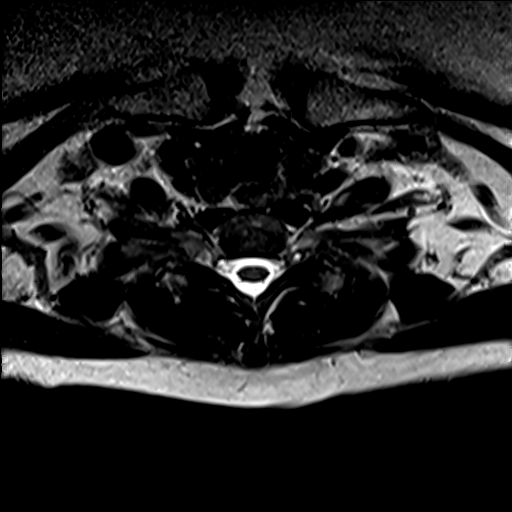

[33 of 48 positions shown; findings below may reference images not displayed]

FINDINGS: Alignment: Exaggerated thoracic kyphosis. Trace retrolisthesis of
T11 on T12.

Vertebrae: Interval posterior pedicle screw and rod fusion extending
from T10 into the lumbar spine. No fracture or suspicious marrow
lesion. Endplate edema anteriorly from T6-T10, likely degenerative.
Mild endplate and bilateral facet edema at T2-3.

Cord: Limited assessment of the spinal cord below T9 due to magnetic
susceptibility artifact from posterior spinal instrumentation. No
cord signal abnormality identified more superiorly in the thoracic
spine.

Paraspinal and other soft tissues: Unremarkable.

Disc levels:

At T7-8, there is an unchanged small central disc protrusion with
slight ventral cord flattening but no significant stenosis.

At T8-9, a left paracentral disc protrusion has regressed with only
a shallow protrusion remaining and without residual spinal stenosis
or spinal cord mass effect.

Assessment of T9-T12 is limited by susceptibility artifact, however
no compressive spinal canal or neural foraminal stenosis is
suspected.

Advanced upper thoracic facet arthrosis has progressed from the
prior study and is greatest on the left at T1-2 with resultant
moderate left neural foraminal stenosis.
IMPRESSION: 1. Interval posterior thoracolumbar fusion with assessment of this
region limited by artifact.
2. Regressed disc protrusion at T8-9 without residual stenosis.
3. Unchanged small disc protrusion at T7-8 without stenosis.
4. Progressive upper thoracic facet arthrosis with moderate left
neural foraminal stenosis at T1-2.
